# Patient Record
Sex: Female | Born: 1935 | Race: White | Hispanic: No | Smoking: Never smoker
Health system: Southern US, Community
[De-identification: ages and names within clinical notes are randomized; demographics above are authoritative.]

## PROBLEM LIST (undated history)

## (undated) DIAGNOSIS — E079 Disorder of thyroid, unspecified: Secondary | ICD-10-CM

## (undated) DIAGNOSIS — I10 Essential (primary) hypertension: Secondary | ICD-10-CM

## (undated) DIAGNOSIS — M1990 Unspecified osteoarthritis, unspecified site: Secondary | ICD-10-CM

## (undated) DIAGNOSIS — I509 Heart failure, unspecified: Secondary | ICD-10-CM

## (undated) DIAGNOSIS — J45909 Unspecified asthma, uncomplicated: Secondary | ICD-10-CM

## (undated) DIAGNOSIS — E119 Type 2 diabetes mellitus without complications: Secondary | ICD-10-CM

## (undated) HISTORY — PX: CORONARY STENT PLACEMENT: SHX1402

---

## 2010-03-02 ENCOUNTER — Ambulatory Visit (HOSPITAL_COMMUNITY)
Admission: RE | Admit: 2010-03-02 | Discharge: 2010-03-02 | Payer: Self-pay | Admitting: Physical Medicine and Rehabilitation

## 2013-07-23 ENCOUNTER — Encounter (HOSPITAL_COMMUNITY): Payer: Self-pay | Admitting: *Deleted

## 2013-07-23 ENCOUNTER — Observation Stay (HOSPITAL_COMMUNITY): Payer: Medicare Other

## 2013-07-23 ENCOUNTER — Observation Stay (HOSPITAL_COMMUNITY)
Admission: EM | Admit: 2013-07-23 | Discharge: 2013-07-25 | Disposition: A | Discharge status: Home / Self Care | Payer: Medicare Other | Attending: Internal Medicine | Admitting: Internal Medicine

## 2013-07-23 DIAGNOSIS — E1142 Type 2 diabetes mellitus with diabetic polyneuropathy: Secondary | ICD-10-CM | POA: Insufficient documentation

## 2013-07-23 DIAGNOSIS — E1149 Type 2 diabetes mellitus with other diabetic neurological complication: Secondary | ICD-10-CM | POA: Insufficient documentation

## 2013-07-23 DIAGNOSIS — F0390 Unspecified dementia without behavioral disturbance: Secondary | ICD-10-CM

## 2013-07-23 DIAGNOSIS — I5020 Unspecified systolic (congestive) heart failure: Secondary | ICD-10-CM | POA: Insufficient documentation

## 2013-07-23 DIAGNOSIS — T7840XA Allergy, unspecified, initial encounter: Secondary | ICD-10-CM

## 2013-07-23 DIAGNOSIS — I2510 Atherosclerotic heart disease of native coronary artery without angina pectoris: Secondary | ICD-10-CM | POA: Diagnosis present

## 2013-07-23 DIAGNOSIS — M129 Arthropathy, unspecified: Secondary | ICD-10-CM | POA: Insufficient documentation

## 2013-07-23 DIAGNOSIS — N179 Acute kidney failure, unspecified: Secondary | ICD-10-CM | POA: Insufficient documentation

## 2013-07-23 DIAGNOSIS — Z86711 Personal history of pulmonary embolism: Secondary | ICD-10-CM | POA: Insufficient documentation

## 2013-07-23 DIAGNOSIS — E785 Hyperlipidemia, unspecified: Secondary | ICD-10-CM | POA: Diagnosis present

## 2013-07-23 DIAGNOSIS — Z79899 Other long term (current) drug therapy: Secondary | ICD-10-CM | POA: Insufficient documentation

## 2013-07-23 DIAGNOSIS — I509 Heart failure, unspecified: Secondary | ICD-10-CM | POA: Insufficient documentation

## 2013-07-23 DIAGNOSIS — E039 Hypothyroidism, unspecified: Secondary | ICD-10-CM | POA: Insufficient documentation

## 2013-07-23 DIAGNOSIS — I10 Essential (primary) hypertension: Secondary | ICD-10-CM | POA: Diagnosis present

## 2013-07-23 DIAGNOSIS — Z86718 Personal history of other venous thrombosis and embolism: Secondary | ICD-10-CM | POA: Insufficient documentation

## 2013-07-23 DIAGNOSIS — Z9861 Coronary angioplasty status: Secondary | ICD-10-CM | POA: Insufficient documentation

## 2013-07-23 DIAGNOSIS — T50995A Adverse effect of other drugs, medicaments and biological substances, initial encounter: Secondary | ICD-10-CM

## 2013-07-23 DIAGNOSIS — Z7901 Long term (current) use of anticoagulants: Secondary | ICD-10-CM | POA: Insufficient documentation

## 2013-07-23 DIAGNOSIS — IMO0002 Reserved for concepts with insufficient information to code with codable children: Principal | ICD-10-CM | POA: Insufficient documentation

## 2013-07-23 DIAGNOSIS — L500 Allergic urticaria: Secondary | ICD-10-CM | POA: Insufficient documentation

## 2013-07-23 DIAGNOSIS — L0390 Cellulitis, unspecified: Secondary | ICD-10-CM

## 2013-07-23 DIAGNOSIS — T364X5A Adverse effect of tetracyclines, initial encounter: Secondary | ICD-10-CM | POA: Insufficient documentation

## 2013-07-23 DIAGNOSIS — L0291 Cutaneous abscess, unspecified: Secondary | ICD-10-CM

## 2013-07-23 HISTORY — DX: Unspecified osteoarthritis, unspecified site: M19.90

## 2013-07-23 HISTORY — DX: Disorder of thyroid, unspecified: E07.9

## 2013-07-23 HISTORY — DX: Type 2 diabetes mellitus without complications: E11.9

## 2013-07-23 HISTORY — DX: Unspecified asthma, uncomplicated: J45.909

## 2013-07-23 HISTORY — DX: Essential (primary) hypertension: I10

## 2013-07-23 HISTORY — DX: Heart failure, unspecified: I50.9

## 2013-07-23 LAB — GLUCOSE, CAPILLARY: Glucose-Capillary: 150 mg/dL — ABNORMAL HIGH (ref 70–99)

## 2013-07-23 LAB — CBC WITH DIFFERENTIAL/PLATELET
Basophils Absolute: 0 10*3/uL (ref 0.0–0.1)
Hemoglobin: 13.6 g/dL (ref 12.0–15.0)
Lymphs Abs: 1.6 10*3/uL (ref 0.7–4.0)
MCH: 30.4 pg (ref 26.0–34.0)
MCV: 89.5 fL (ref 78.0–100.0)
Neutro Abs: 8.5 10*3/uL — ABNORMAL HIGH (ref 1.7–7.7)
Neutrophils Relative %: 74 % (ref 43–77)
RBC: 4.48 MIL/uL (ref 3.87–5.11)
RDW: 13.4 % (ref 11.5–15.5)
WBC: 11.5 10*3/uL — ABNORMAL HIGH (ref 4.0–10.5)

## 2013-07-23 LAB — BASIC METABOLIC PANEL
CO2: 27 mEq/L (ref 19–32)
Creatinine, Ser: 1.21 mg/dL — ABNORMAL HIGH (ref 0.50–1.10)
GFR calc non Af Amer: 37 mL/min — ABNORMAL LOW (ref >90)
Glucose, Bld: 175 mg/dL — ABNORMAL HIGH (ref 70–99)
Potassium: 3.9 mEq/L (ref 3.5–5.1)

## 2013-07-23 LAB — PROTIME-INR
INR: 2.06 — ABNORMAL HIGH (ref 0.00–1.49)
Prothrombin Time: 22.6 seconds — ABNORMAL HIGH (ref 11.6–15.2)

## 2013-07-23 MED ORDER — METOPROLOL SUCCINATE ER 25 MG PO TB24
25.0000 mg | ORAL_TABLET | Freq: Every day | ORAL | Status: DC
  Administered 2013-07-24 – 2013-07-25 (×2): 25 mg via ORAL
  Filled 2013-07-23 (×2): qty 1

## 2013-07-23 MED ORDER — SODIUM CHLORIDE 0.9 % IJ SOLN
3.0000 mL | Freq: Two times a day (BID) | INTRAMUSCULAR | Status: DC
  Administered 2013-07-23 – 2013-07-25 (×3): 3 mL via INTRAVENOUS

## 2013-07-23 MED ORDER — LEVOTHYROXINE SODIUM 75 MCG PO TABS
75.0000 ug | ORAL_TABLET | Freq: Every day | ORAL | Status: DC
  Administered 2013-07-23 – 2013-07-24 (×2): 75 ug via ORAL
  Filled 2013-07-23 (×2): qty 1

## 2013-07-23 MED ORDER — DIPHENHYDRAMINE HCL 50 MG/ML IJ SOLN
12.5000 mg | Freq: Once | INTRAMUSCULAR | Status: AC
  Administered 2013-07-23: 12.5 mg via INTRAVENOUS
  Filled 2013-07-23 (×2): qty 1

## 2013-07-23 MED ORDER — LEVOTHYROXINE SODIUM 75 MCG PO TABS: 75.0000 ug | ORAL_TABLET | Freq: Every day | ORAL | Status: DC

## 2013-07-23 MED ORDER — DEXTROSE 5 % IV SOLN
1.0000 g | Freq: Once | INTRAVENOUS | Status: AC
  Administered 2013-07-23: 1 g via INTRAVENOUS
  Filled 2013-07-23: qty 10

## 2013-07-23 MED ORDER — DOXYCYCLINE HYCLATE 100 MG PO CAPS: 100.0000 mg | ORAL_CAPSULE | Freq: Two times a day (BID) | ORAL | Status: DC

## 2013-07-23 MED ORDER — GABAPENTIN 300 MG PO CAPS
300.0000 mg | ORAL_CAPSULE | Freq: Three times a day (TID) | ORAL | Status: DC
  Administered 2013-07-23 – 2013-07-25 (×6): 300 mg via ORAL
  Filled 2013-07-23 (×6): qty 1

## 2013-07-23 MED ORDER — SODIUM CHLORIDE 0.9 % IV SOLN: INTRAVENOUS | Status: AC

## 2013-07-23 MED ORDER — LORATADINE 10 MG PO TABS
10.0000 mg | ORAL_TABLET | Freq: Every day | ORAL | Status: DC
  Administered 2013-07-24 – 2013-07-25 (×2): 10 mg via ORAL
  Filled 2013-07-23 (×2): qty 1

## 2013-07-23 MED ORDER — WARFARIN - PHARMACIST DOSING INPATIENT: Status: DC

## 2013-07-23 MED ORDER — ATORVASTATIN CALCIUM 40 MG PO TABS
40.0000 mg | ORAL_TABLET | Freq: Every day | ORAL | Status: DC
  Administered 2013-07-23 – 2013-07-24 (×2): 40 mg via ORAL
  Filled 2013-07-23 (×2): qty 1

## 2013-07-23 MED ORDER — HYDROCODONE-ACETAMINOPHEN 5-325 MG PO TABS
1.0000 | ORAL_TABLET | Freq: Four times a day (QID) | ORAL | Status: DC | PRN
  Administered 2013-07-23 – 2013-07-25 (×3): 1 via ORAL
  Filled 2013-07-23 (×3): qty 1

## 2013-07-23 MED ORDER — OFLOXACIN 0.3 % OT SOLN
5.0000 [drp] | Freq: Every day | OTIC | Status: DC | PRN
  Filled 2013-07-23: qty 5

## 2013-07-23 MED ORDER — INSULIN ASPART 100 UNIT/ML ~~LOC~~ SOLN
0.0000 [IU] | Freq: Three times a day (TID) | SUBCUTANEOUS | Status: DC
  Administered 2013-07-24 (×2): 1 [IU] via SUBCUTANEOUS
  Administered 2013-07-24 – 2013-07-25 (×2): 2 [IU] via SUBCUTANEOUS

## 2013-07-23 MED ORDER — DOXYCYCLINE HYCLATE 100 MG IV SOLR
100.0000 mg | Freq: Once | INTRAVENOUS | Status: AC
  Administered 2013-07-23: 100 mg via INTRAVENOUS
  Filled 2013-07-23: qty 100

## 2013-07-23 MED ORDER — WARFARIN SODIUM 7.5 MG PO TABS
7.5000 mg | ORAL_TABLET | Freq: Once | ORAL | Status: AC
  Administered 2013-07-23: 7.5 mg via ORAL
  Filled 2013-07-23: qty 1

## 2013-07-23 NOTE — ED Provider Notes (Addendum)
CSN: [id number]     Arrival date & time 07/23/13  0944 History  This chart was scribed for Gilda Crease, MD by Bennett Scrape, ED Scribe. This patient was seen in room APA19/APA19 and the patient's care was started at 9:54 AM.   Chief Complaint  Patient presents with  . Arm Injury    The history is provided by the patient. No language interpreter was used.    HPI Comments: Katelyn Kramer is a 79 y.o. female who presents to the Emergency Department complaining of gradual onset, gradually worsening, constant right forearm pain with associated redness and swelling that started yesterday. She states that she was scratched by a cat on the right hand the day before. The pt is currently on anticoagulants. She denies any known fevers, SOB or other pain.  Pt lives with granddaughter who states that the pt has mild confusion at baseline.   Past Medical History  Diagnosis Date  . Arthritis   . Hypertension   . Thyroid disease   . Diabetes mellitus without complication   . CHF (congestive heart failure)    Past Surgical History  Procedure Laterality Date  . Coronary stent placement     History reviewed. No pertinent family history. History  Substance Use Topics  . Smoking status: Never Smoker   . Smokeless tobacco: Not on file  . Alcohol Use: No   No OB history provided.  Review of Systems  Constitutional: Negative for fever.  Musculoskeletal: Positive for arthralgias.  Skin: Positive for color change and wound.  All other systems reviewed and are negative.    Allergies  Review of patient's allergies indicates no known allergies.  Home Medications  No current outpatient prescriptions on file.  Triage Vitals: BP 130/63  Pulse 82  Temp(Src) 98.6 F (37 C) (Oral)  Resp 19  Ht 5\' 7"  (1.702 m)  Wt 195 lb (88.451 kg)  BMI 30.53 kg/m2  SpO2 95%  Physical Exam  Nursing note and vitals reviewed. Constitutional: She is oriented to person, place, and time. She appears  well-developed and well-nourished. No distress.  HENT:  Head: Normocephalic and atraumatic.  Eyes: EOM are normal.  Neck: Neck supple. No tracheal deviation present.  Cardiovascular: Normal rate.   Pulmonary/Chest: Effort normal. No respiratory distress.  Musculoskeletal: Normal range of motion.  Erythema and warmth to the anterior surface of the right forearm   Neurological: She is alert and oriented to person, place, and time.  Skin: Skin is warm and dry.  Psychiatric: She has a normal mood and affect. Her behavior is normal.    ED Course  Procedures (including critical care time)  DIAGNOSTIC STUDIES: Oxygen Saturation is 95% on room air, adequate by my interpretation.    COORDINATION OF CARE: 9:57 AM-Discussed treatment plan which includes CBC panel, BMP and blood culture with pt at bedside and pt agreed to plan.   Labs Review Labs Reviewed  CBC WITH DIFFERENTIAL - Abnormal; Notable for the following:    WBC 11.5 (*)    Neutro Abs 8.5 (*)    Monocytes Absolute 1.1 (*)    All other components within normal limits  BASIC METABOLIC PANEL - Abnormal; Notable for the following:    Glucose, Bld 175 (*)    BUN 24 (*)    Creatinine, Ser 1.21 (*)    GFR calc non Af Amer 37 (*)    GFR calc Af Amer 43 (*)    All other components within normal limits  PROTIME-INR -  Abnormal; Notable for the following:    Prothrombin Time 22.6 (*)    INR 2.06 (*)    All other components within normal limits  CULTURE, BLOOD (ROUTINE X 2)  CULTURE, BLOOD (ROUTINE X 2)   Imaging Review No results found.  MDM  Diagnosis: 1. Cellulitis 2. Allergic Reaction to Doxycycline  Patient presents with increased warmth, swelling, erythema of the back of the right hand and lower arm with lymphangitis up the volar aspect of the arm. Patient thinks she was scratched by a cat 2 days ago and symptoms began yesterday. Family reports that she is somewhat confused, but did not see any scratches, but it was possible  because the cat was in her room. This is not consistent with cat scratch fever, more likely simple cellulitis. Patient administered IV doxycycline. Approximately 15 minutes before the infusion completed, patient started to have hives appear just proximal to the IV site. Additionally she was stopped and the patient was given Benadryl 12.5 mg. no systemic findings, vital signs remained stable.  As the patient is elderly, diabetic, has some worsening signs of cellulitis and has now had an acute allergic reaction to meds, it is felt that the patient would be best served being admitted to the hospital for further management. Case discussed with hospitalist, Doctor Wyonia Hough. Will admit to the hospital, switched to Rocephin.  I personally performed the services described in this documentation, which was scribed in my presence. The recorded information has been reviewed and is accurate.    Gilda Crease, MD 07/23/13 1320  Gilda Crease, MD 07/23/13 1345

## 2013-07-23 NOTE — H&P (Signed)
[facility] History and Physical  Katelyn Kramer ZOX:[id number] DOB: [birth year] DOA: 07/23/2013  Referring physician: Dr. Blinda Leatherwood PCP: Ernestine Conrad, MD  Specialists: none  Chief Complaint: right wrist cellulitis  HPI: Katelyn Kramer is a 79 y.o. female has a past medical history significant for CAD s/p stent, history of DVT/PE on chronic anticoagulation, HTN, HLD, hypothyroidism, comes in with right hand swelling after a cat scratch 2 days ago. She also endorses mild traums to the hand. She has a history of dementia and cannot remember how she hit her hand. She denies any CP, SOB, NVD, HA, lightheadedness or dizziness. She lives with her daughter and her granddaughter is helping as she lives close by. She has 24 h supervision it seems like. In the emergency room, patient was given doxycycline for her right hand cellulitis, however she developed what sounds like an allergic local reaction to it. This has resolved with antibiotic discontinuation and Benadryl administration.  Review of Systems: as per HPI otherwise negative, however somewhat unreliable given dementia  Past Medical History  Diagnosis Date  . Arthritis   . Hypertension   . Thyroid disease   . Diabetes mellitus without complication   . CHF (congestive heart failure)    Past Surgical History  Procedure Laterality Date  . Coronary stent placement     Social History:  reports that she has never smoked. She does not have any smokeless tobacco history on file. She reports that she does not drink alcohol or use illicit drugs.  Allergies  Allergen Reactions  . Doxycycline Hives    History reviewed. No pertinent family history.  Prior to Admission medications   Medication Sig Start Date End Date Taking? Authorizing Provider  amLODipine-benazepril (LOTREL) 5-20 MG per capsule Take 1 capsule by mouth daily.   Yes Historical Provider, MD  atorvastatin (LIPITOR) 40 MG tablet Take 40 mg by mouth daily.   Yes Historical Provider,  MD  fexofenadine (ALLEGRA) 180 MG tablet Take 180 mg by mouth daily.   Yes Historical Provider, MD  fish oil-omega-3 fatty acids 1000 MG capsule Take 2 g by mouth 2 (two) times daily.   Yes Historical Provider, MD  furosemide (LASIX) 40 MG tablet Take 40 mg by mouth.   Yes Historical Provider, MD  gabapentin (NEURONTIN) 100 MG capsule Take 300 mg by mouth 3 (three) times daily.   Yes Historical Provider, MD  glipiZIDE (GLUCOTROL) 10 MG tablet Take 10 mg by mouth daily.   Yes Historical Provider, MD  glucosamine-chondroitin 500-400 MG tablet Take 1 tablet by mouth 3 (three) times daily.   Yes Historical Provider, MD  HYDROcodone-acetaminophen (NORCO/VICODIN) 5-325 MG per tablet Take 0.5-1 tablets by mouth every 6 (six) hours as needed for pain.    Yes Historical Provider, MD  levothyroxine (SYNTHROID, LEVOTHROID) 75 MCG tablet Take 75 mcg by mouth at bedtime.   Yes Historical Provider, MD  metFORMIN (GLUCOPHAGE) 500 MG tablet Take 500 mg by mouth 2 (two) times daily with a meal.   Yes Historical Provider, MD  metoprolol succinate (TOPROL-XL) 25 MG 24 hr tablet Take 25 mg by mouth daily.   Yes Historical Provider, MD  ofloxacin (FLOXIN) 0.3 % otic solution Place 5 drops into both ears daily as needed (ear pain).   Yes Historical Provider, MD  potassium chloride SA (K-DUR,KLOR-CON) 20 MEQ tablet Take 20 mEq by mouth 2 (two) times daily.   Yes Historical Provider, MD  sitaGLIPtin (JANUVIA) 100 MG tablet Take 100 mg by mouth daily.  Yes Historical Provider, MD  warfarin (COUMADIN) 7.5 MG tablet Take 3.75-7.5 mg by mouth every morning. Takes 7.5 mg on Monday, Wednesday, Friday, Saturday and Sunday.  Takes 3.75 mg on Tuesday and Thursday.   Yes Historical Provider, MD  doxycycline (VIBRAMYCIN) 100 MG capsule Take 1 capsule (100 mg total) by mouth 2 (two) times daily. 07/23/13   Gilda Crease, MD   Physical Exam: Filed Vitals:   07/23/13 1004 07/23/13 1230  BP: 130/63 120/67  Pulse: 82 70   Temp: 98.6 F (37 C)   TempSrc: Oral   Resp: 19 21  Height: 5\' 7"  (1.702 m)   Weight: 88.451 kg (195 lb)   SpO2: 95% 93%     General:  No apparent distress, pleasant demented Caucasian female  Eyes: PERRL, EOMI, no scleral icterus  ENT: moist oropharynx  Neck: supple, no JVD  Cardiovascular: regular rate without MRG; 2+ peripheral pulses  Respiratory: CTA biL, good air movement without wheezing, rhonchi or crackled  Abdomen: soft, non tender to palpation, positive bowel sounds, no guarding, no rebound  Skin: no rashes  Musculoskeletal: no peripheral edema; mild swelling of the posterior aspect of the right hand extending over the wrist and proximal forearm. Minimal pain with flexion of the fingers and wrist.  Psychiatric: normal mood and affect  Neurologic: Nonfocal  Labs on Admission:  Basic Metabolic Panel:  Recent Labs Lab 07/23/13 1018  NA 137  K 3.9  CL 98  CO2 27  GLUCOSE 175*  BUN 24*  CREATININE 1.21*  CALCIUM 9.3   CBC:  Recent Labs Lab 07/23/13 1018  WBC 11.5*  NEUTROABS 8.5*  HGB 13.6  HCT 40.1  MCV 89.5  PLT 225    Radiological Exams on Admission: No results found.  EKG: Independently reviewed.  Assessment/Plan Principal Problem:   Cellulitis Active Problems:   Dementia   Essential hypertension, benign   Unspecified hypothyroidism   HLD (hyperlipidemia)   CAD (coronary artery disease)   History of DVT (deep vein thrombosis)   AKI (acute kidney injury)   Chronic anticoagulation  Right hand cellulitis - Start ceftriaxone. She also injured her right hand, however he does not recall how. We'll obtain plain x-rays given the swelling. Hypothyroidism - check TSH Hypertension -Continue metoprolol Diabetes - sliding scale insulin Diabetic neuropathy - continue gabapentin Hyperlipidemia - Lipitor  Dementia - will check B12 and folate. Check TSH as above. History of DVT - Coumadin per pharmacy CAD - statin, metop DVT  prophylaxis - on Coumadin  Code Status: Full code  Family Communication: Granddaughter and daughter bedside  Disposition Plan: Home when medically ready  Time spent: 76  Costin M. Elvera Lennox, MD [facility] Pager [phone]  If 7PM-7AM, please contact night-coverage [email or web address] Password [facility] 07/23/2013, 3:19 PM

## 2013-07-23 NOTE — ED Notes (Signed)
Pt has swollen rt hand/wrist since cat scratch Saturday.

## 2013-07-23 NOTE — Progress Notes (Signed)
ANTICOAGULATION CONSULT NOTE - Initial Consult  Pharmacy Consult for Warfarin Indication: VTE treatment  Allergies  Allergen Reactions  . Doxycycline Hives    Patient Measurements: Height: 5\' 7"  (170.2 cm) Weight: 195 lb (88.451 kg) IBW/kg (Calculated) : 61.6   Vital Signs: Temp: 98.6 F (37 C) (09/01 1004) Temp src: Oral (09/01 1004) BP: 120/67 mmHg (09/01 1230) Pulse Rate: 70 (09/01 1230)  Labs:  Recent Labs  07/23/13 1018  HGB 13.6  HCT 40.1  PLT 225  LABPROT 22.6*  INR 2.06*  CREATININE 1.21*    Estimated Creatinine Clearance: 33.2 ml/min (by C-G formula based on Cr of 1.21).   Medical History: Past Medical History  Diagnosis Date  . Arthritis   . Hypertension   . Thyroid disease   . Diabetes mellitus without complication   . CHF (congestive heart failure)   . Asthma     Medications:  Scheduled:  . insulin aspart  0-9 Units Subcutaneous TID WC  . sodium chloride  3 mL Intravenous Q12H  . warfarin  7.5 mg Oral Once  . Warfarin - Pharmacist Dosing Inpatient   Does not apply Q24H    Assessment: Continuation of Warfarin from home PTA Warfarin 7.5 mg on Mon,Wed,Fri,Sat,Sun ,  3.75 mg on Tues,Thurs INR therapeutic on admission  Goal of Therapy:  INR 2-3 Monitor platelets by anticoagulation protocol: Yes   Plan:  Warfarin 7.5 mg po today at 4 PM INR/PT daily CBC, monitor platelets   Raquel James, Evelyn Aguinaldo Bennett 07/23/2013,3:50 PM

## 2013-07-23 NOTE — ED Notes (Signed)
Patient has whelp above IV site. IV antibiotics stopped. Denies any itching or pain. IV flushing well. Denies any pain. Redness in arm increased. Dr Blinda Leatherwood made aware, to room to see patient.

## 2013-07-23 NOTE — ED Notes (Signed)
Patient with c/o blister and redness directly proximal to IV site. IV flushes without difficulty and palpable above blistered area and blood returns easily from IV. Redness noted to follow vein IV is in and family reports worsening redness to arm medial to IV site. Denies itching, shortness of breath, or chest pain. No distress. Crystal, primary RN notified and will notify physician.

## 2013-07-24 LAB — CBC
HCT: 35.8 % — ABNORMAL LOW (ref 36.0–46.0)
Hemoglobin: 12.2 g/dL (ref 12.0–15.0)
MCH: 30.6 pg (ref 26.0–34.0)
MCV: 89.7 fL (ref 78.0–100.0)
RBC: 3.99 MIL/uL (ref 3.87–5.11)

## 2013-07-24 LAB — URINE MICROSCOPIC-ADD ON

## 2013-07-24 LAB — HEMOGLOBIN A1C
Hgb A1c MFr Bld: 7.6 % — ABNORMAL HIGH (ref <5.7)
Mean Plasma Glucose: 171 mg/dL — ABNORMAL HIGH (ref <117)

## 2013-07-24 LAB — URINALYSIS, ROUTINE W REFLEX MICROSCOPIC
Glucose, UA: NEGATIVE mg/dL
Ketones, ur: NEGATIVE mg/dL
Protein, ur: NEGATIVE mg/dL
Urobilinogen, UA: 0.2 mg/dL (ref 0.0–1.0)

## 2013-07-24 LAB — BASIC METABOLIC PANEL
CO2: 28 mEq/L (ref 19–32)
Calcium: 8.4 mg/dL (ref 8.4–10.5)
Potassium: 3.8 mEq/L (ref 3.5–5.1)
Sodium: 138 mEq/L (ref 135–145)

## 2013-07-24 LAB — VITAMIN B12: Vitamin B-12: 278 pg/mL (ref 211–911)

## 2013-07-24 LAB — GLUCOSE, CAPILLARY
Glucose-Capillary: 126 mg/dL — ABNORMAL HIGH (ref 70–99)
Glucose-Capillary: 164 mg/dL — ABNORMAL HIGH (ref 70–99)
Glucose-Capillary: 146 mg/dL — ABNORMAL HIGH (ref 70–99)

## 2013-07-24 LAB — TSH: TSH: 0.773 u[IU]/mL (ref 0.350–4.500)

## 2013-07-24 MED ORDER — SODIUM CHLORIDE 0.9 % IV SOLN
3.0000 g | Freq: Four times a day (QID) | INTRAVENOUS | Status: DC
  Administered 2013-07-24 – 2013-07-25 (×5): 3 g via INTRAVENOUS
  Filled 2013-07-24 (×9): qty 3

## 2013-07-24 MED ORDER — WARFARIN SODIUM 2 MG PO TABS
3.0000 mg | ORAL_TABLET | Freq: Once | ORAL | Status: AC
  Administered 2013-07-24: 3 mg via ORAL
  Filled 2013-07-24: qty 1

## 2013-07-24 NOTE — Progress Notes (Signed)
[facility] PROGRESS NOTE  Katelyn Kramer WJX:[id number] DOB: November 29, 1917 DOA: 07/23/2013 PCP: Ernestine Conrad, MD  HPI: Katelyn Kramer is a 79 y.o. female has a past medical history significant for CAD s/p stent, history of DVT/PE on chronic anticoagulation, HTN, HLD, hypothyroidism, comes in with right hand swelling after a cat scratch 2 days ago. She also endorses mild traums to the hand. She has a history of dementia and cannot remember how she hit her hand. She denies any CP, SOB, NVD, HA, lightheadedness or dizziness. She lives with her daughter and her granddaughter is helping as she lives close by. She has 24 h supervision it seems like. In the emergency room, patient was given doxycycline for her right hand cellulitis, however she developed what sounds like an allergic local reaction to it. This has resolved with antibiotic discontinuation and Benadryl administration.  Assessment/Plan: Right hand cellulitis  - Started ceftriaxone in the ED, switch to Unasyn. This morning there is minimal improvement and still apparent streaking upwards from her wrist. She needs another day of IV antibiotics and may be able to switch to po in am. Plain XR normal.  Hypothyroidism - TSH normal Hypertension -Continue metoprolol  Diabetes - sliding scale insulin  Diabetic neuropathy - continue gabapentin  Hyperlipidemia - Lipitor  Dementia - will check B12 and folate. Check TSH as above.  History of DVT  - Coumadin per pharmacy  CAD - statin, metop  DVT prophylaxis - on Coumadin  Code Status: Full Family Communication: daughter bedside  Disposition Plan: home likely 24 h.  Consultants:  none  Procedures:  none  Anti-infectives   Start     Dose/Rate Route Frequency Ordered Stop   07/24/13 1000  Ampicillin-Sulbactam (UNASYN) 3 g in sodium chloride 0.9 % 100 mL IVPB     3 g 100 mL/hr over 60 Minutes Intravenous Every 6 hours 07/24/13 0813     07/23/13 1345  cefTRIAXone (ROCEPHIN) 1 g in dextrose 5  % 50 mL IVPB     1 g 100 mL/hr over 30 Minutes Intravenous  Once 07/23/13 1343 07/23/13 1455   07/23/13 1115  doxycycline (VIBRAMYCIN) 100 mg in dextrose 5 % 250 mL IVPB     100 mg 125 mL/hr over 120 Minutes Intravenous  Once 07/23/13 1111 07/23/13 1326   07/23/13 0000  doxycycline (VIBRAMYCIN) 100 MG capsule     100 mg Oral 2 times daily 07/23/13 1322       Antibiotics Given (last 72 hours)   Date/Time Action Medication Dose Rate   07/24/13 0957 Given   Ampicillin-Sulbactam (UNASYN) 3 g in sodium chloride 0.9 % 100 mL IVPB 3 g 100 mL/hr      HPI/Subjective: - complains of pain right wrist  Objective: Filed Vitals:   07/23/13 1004 07/23/13 1230 07/24/13 0533  BP: 130/63 120/67 139/67  Pulse: 82 70 70  Temp: 98.6 F (37 C)  98 F (36.7 C)  TempSrc: Oral  Oral  Resp: 19 21 20   Height: 5\' 7"  (1.702 m)    Weight: 88.451 kg (195 lb)    SpO2: 95% 93% 93%    Intake/Output Summary (Last 24 hours) at 07/24/13 1103 Last data filed at 07/24/13 0900  Gross per 24 hour  Intake    240 ml  Output    300 ml  Net    -60 ml   Filed Weights   07/23/13 1004  Weight: 88.451 kg (195 lb)    Exam:  General: No apparent distress, pleasant demented  Caucasian female Eyes: PERRL, EOMI, no scleral icterus ENT: moist oropharynx Neck: supple, no JVD Cardiovascular: regular rate without MRG; 2+ peripheral pulses Respiratory: CTA biL, good air movement without wheezing, rhonchi or crackled Abdomen: soft, non tender to palpation, positive bowel sounds, no guarding, no rebound Skin: no rashes Musculoskeletal: no peripheral edema; mild swelling of the posterior aspect of the right hand extending over the wrist and proximal forearm extending upwards. Minimal if any improvement overnight. Margins drawn this morning. Still minimal pain with flexion of the fingers and wrist. Psychiatric: normal mood and affect Neurologic: Nonfocal   Data Reviewed: Basic Metabolic Panel:  Recent Labs Lab  08-20-2013 1018 07/24/13 0614  NA 137 138  K 3.9 3.8  CL 98 100  CO2 27 28  GLUCOSE 175* 137*  BUN 24* 22  CREATININE 1.21* 0.99  CALCIUM 9.3 8.4   CBC:  Recent Labs Lab 08-20-2013 1018 07/24/13 0614  WBC 11.5* 7.7  NEUTROABS 8.5*  --   HGB 13.6 12.2  HCT 40.1 35.8*  MCV 89.5 89.7  PLT 225 211   CBG:  Recent Labs Lab 08-20-2013 1655 Aug 20, 2013 2101 07/24/13 0726  GLUCAP 87 150* 126*    Recent Results (from the past 240 hour(s))  CULTURE, BLOOD (ROUTINE X 2)     Status: None   Collection Time    08/20/2013 10:18 AM      Result Value Range Status   Specimen Description BLOOD RIGHT FOREARM   Final   Special Requests BOTTLES DRAWN AEROBIC AND ANAEROBIC 6CC   Final   Culture NO GROWTH 1 DAY   Final   Report Status PENDING   Incomplete  CULTURE, BLOOD (ROUTINE X 2)     Status: None   Collection Time    Aug 20, 2013 10:29 AM      Result Value Range Status   Specimen Description BLOOD LEFT HAND   Final   Special Requests BOTTLES DRAWN AEROBIC AND ANAEROBIC 6CC   Final   Culture NO GROWTH 1 DAY   Final   Report Status PENDING   Incomplete     Studies: Dg Wrist 2 Views Right  Aug 20, 2013   *RADIOLOGY REPORT*  Clinical Data: Swelling  RIGHT WRIST - 2 VIEW  Comparison: None.  Findings: Normal alignment without fracture.  Osteoarthritis of the right first [facility] joint and the carpal bones along the radial aspect of the wrist.  Distal radius and ulna intact.  No soft tissue abnormality.  IMPRESSION: Osteoarthritis.  No acute finding.   Original Report Authenticated By: Judie Petit. Miles Costain, M.D.   Dg Hand 2 View Right  08/20/13   CLINICAL DATA:  Right hand swelling.  EXAM: RIGHT HAND - 2 VIEW  COMPARISON:  None.  FINDINGS: Early degenerative delete that mild degenerative changes in the IP joint and 1st carpometacarpal joint. No acute bony abnormality. Specifically, no fracture, subluxation, or dislocation. Soft tissues are intact. No radiopaque foreign bodies.  IMPRESSION: No acute findings.    Electronically Signed   By: Charlett Nose   On: 08-20-2013 16:43    Scheduled Meds: . ampicillin-sulbactam (UNASYN) IV  3 g Intravenous Q6H  . atorvastatin  40 mg Oral q1800  . gabapentin  300 mg Oral TID  . insulin aspart  0-9 Units Subcutaneous TID WC  . levothyroxine  75 mcg Oral QHS  . loratadine  10 mg Oral Daily  . metoprolol succinate  25 mg Oral Daily  . sodium chloride  3 mL Intravenous Q12H  . warfarin  3  mg Oral Once  . Warfarin - Pharmacist Dosing Inpatient   Does not apply Q24H   Continuous Infusions:   Principal Problem:   Cellulitis Active Problems:   Dementia   Essential hypertension, benign   Unspecified hypothyroidism   HLD (hyperlipidemia)   CAD (coronary artery disease)   History of DVT (deep vein thrombosis)   AKI (acute kidney injury)   Chronic anticoagulation   Time spent: 35  Pamella Pert, MD [facility] Pager [phone]. If 7 PM - 7 AM, please contact night-coverage at [email or web address], password [facility] 07/24/2013, 11:03 AM  LOS: 1 day

## 2013-07-24 NOTE — Clinical Social Work Note (Signed)
CSW met with pt and pt's daughter Katelyn Kramer at bedside following referral for advance directives. They had copy of advance directives already. Pt alert and oriented and CSW discussed HCPOA and living will. She filled out HCPOA and part of living will, but then became confused and frustrated with some of the terms. Pt requested to revisit tomorrow. Daughter aware that pt will need to understand and clearly explain living will in order to complete. Pt lives with Katelyn Kramer and manages fine at home. She has a walker and motorized wheelchair. Pt plans to return home at d/c.   Derenda Fennel, LCSW [phone]

## 2013-07-24 NOTE — Progress Notes (Signed)
ANTICOAGULATION CONSULT NOTE  Pharmacy Consult for Warfarin Indication: hx of DVT  Allergies  Allergen Reactions  . Doxycycline Hives    Patient Measurements: Height: 5\' 7"  (170.2 cm) Weight: 195 lb (88.451 kg) IBW/kg (Calculated) : 61.6   Vital Signs: Temp: 98 F (36.7 C) (09/02 0533) Temp src: Oral (09/02 0533) BP: 139/67 mmHg (09/02 0533) Pulse Rate: 70 (09/02 0533)  Labs:  Recent Labs  07/23/13 1018 07/24/13 0614  HGB 13.6 12.2  HCT 40.1 35.8*  PLT 225 211  LABPROT 22.6* 26.4*  INR 2.06* 2.53*  CREATININE 1.21* 0.99    Estimated Creatinine Clearance: 40.6 ml/min (by C-G formula based on Cr of 0.99).   Medical History: Past Medical History  Diagnosis Date  . Arthritis   . Hypertension   . Thyroid disease   . Diabetes mellitus without complication   . CHF (congestive heart failure)   . Asthma     Medications:  Scheduled:  . ampicillin-sulbactam (UNASYN) IV  3 g Intravenous Q6H  . atorvastatin  40 mg Oral q1800  . gabapentin  300 mg Oral TID  . insulin aspart  0-9 Units Subcutaneous TID WC  . levothyroxine  75 mcg Oral QHS  . loratadine  10 mg Oral Daily  . metoprolol succinate  25 mg Oral Daily  . sodium chloride  3 mL Intravenous Q12H  . Warfarin - Pharmacist Dosing Inpatient   Does not apply Q24H    Assessment: 79 yo F on chronic warfarin 7.5 mg daily except 3.75 mg on Tues &Thurs for hx DVT. INR therapeutic on admission. She was admitted for IV antibiotics to treat a cat scratch.   No bleeding noted.   Goal of Therapy:  INR 2-3   Plan:  Warfarin 3mg  po x1 today INR daily  Elson Clan 07/24/2013,8:42 AM

## 2013-07-24 NOTE — Care Management Note (Signed)
    Page 1 of 1   07/25/2013     4:28:10 PM   CARE MANAGEMENT NOTE 07/25/2013  Patient:  Katelyn Kramer, Katelyn Kramer   Account Number:  [id number]  Date Initiated:  07/24/2013  Documentation initiated by:  Anibal Henderson  Subjective/Objective Assessment:   Admitted with rt hand/forearm cellulitis. Pt states she was scratched by her cat, but not sure if this is the cause. Pt is from home with daughter, whe is a Engineer, civil (consulting), so they do not think they will need any thing at D/C even if pt needs  dsgs     Action/Plan:   No CM needs identified at present- will follow   Anticipated DC Date:  07/25/2013   Anticipated DC Plan:  HOME/SELF CARE      DC Planning Services  CM consult      Choice offered to / List presented to:             Status of service:  Completed, signed off Medicare Important Message given?   (If response is "NO", the following Medicare IM given date fields will be blank) Date Medicare IM given:   Date Additional Medicare IM given:    Discharge Disposition:  HOME/SELF CARE  Per UR Regulation:  Reviewed for med. necessity/level of care/duration of stay  If discussed at Long Length of Stay Meetings, dates discussed:    Comments:  07/24/13 1130 Anibal Henderson RN/CM

## 2013-07-24 NOTE — Progress Notes (Signed)
UR Chart Review Completed  

## 2013-07-24 NOTE — Progress Notes (Signed)
PT Cancellation Note  Patient Details Name: Katelyn Kramer MRN: [id number] DOB: 03-29-1918   Cancelled Treatment:    Reason Eval/Treat Not Completed: PT screened, no needs identified, will sign off Interviewed pt/daughter.  Pt ambulates with a walker and lives with daughter.  Daughter states that pt has been managing well at home and they do not anticipate any new problems.  She should not have any difficulty gripping the walker with her right hand..idiopathic thrombocytopenic purpura has full ROM and she is using the hand functionally this AM.  If any concerns arise, please reconsult.  Myrlene Broker L 07/24/2013, 8:53 AM

## 2013-07-25 LAB — CBC
MCH: 30.4 pg (ref 26.0–34.0)
MCV: 90.2 fL (ref 78.0–100.0)
Platelets: 229 10*3/uL (ref 150–400)
RDW: 13.4 % (ref 11.5–15.5)
WBC: 6.7 10*3/uL (ref 4.0–10.5)

## 2013-07-25 LAB — GLUCOSE, CAPILLARY

## 2013-07-25 LAB — URINE CULTURE

## 2013-07-25 MED ORDER — AMOXICILLIN-POT CLAVULANATE 500-125 MG PO TABS: 1.0000 | ORAL_TABLET | Freq: Three times a day (TID) | ORAL | Status: DC

## 2013-07-25 MED ORDER — WARFARIN SODIUM 7.5 MG PO TABS: 7.5000 mg | ORAL_TABLET | Freq: Once | ORAL | Status: DC

## 2013-07-25 NOTE — Progress Notes (Signed)
ANTICOAGULATION CONSULT NOTE  Pharmacy Consult for Warfarin Indication: hx of DVT  Allergies  Allergen Reactions  . Doxycycline Hives    Patient Measurements: Height: 5\' 7"  (170.2 cm) Weight: 195 lb (88.451 kg) IBW/kg (Calculated) : 61.6   Vital Signs: Temp: 98.4 F (36.9 C) (09/03 0536) Temp src: Oral (09/03 0536) BP: 160/92 mmHg (09/03 0536) Pulse Rate: 59 (09/03 0536)  Labs:  Recent Labs  07/23/13 1018 07/24/13 0614 07/25/13 0600  HGB 13.6 12.2 12.7  HCT 40.1 35.8* 37.7  PLT 225 211 229  LABPROT 22.6* 26.4* 26.3*  INR 2.06* 2.53* 2.52*  CREATININE 1.21* 0.99  --     Estimated Creatinine Clearance: 40.6 ml/min (by C-G formula based on Cr of 0.99).   Medical History: Past Medical History  Diagnosis Date  . Arthritis   . Hypertension   . Thyroid disease   . Diabetes mellitus without complication   . CHF (congestive heart failure)   . Asthma     Medications:  Scheduled:  . ampicillin-sulbactam (UNASYN) IV  3 g Intravenous Q6H  . atorvastatin  40 mg Oral q1800  . gabapentin  300 mg Oral TID  . insulin aspart  0-9 Units Subcutaneous TID WC  . levothyroxine  75 mcg Oral QHS  . loratadine  10 mg Oral Daily  . metoprolol succinate  25 mg Oral Daily  . sodium chloride  3 mL Intravenous Q12H  . warfarin  7.5 mg Oral ONCE-1800  . Warfarin - Pharmacist Dosing Inpatient   Does not apply Q24H    Assessment: 79 yo F on chronic warfarin 7.5 mg daily except 3.75 mg on Tues &Thurs for hx DVT. INR therapeutic on admission. She was admitted for IV antibiotics to treat a cat scratch.   No bleeding noted.   Goal of Therapy:  INR 2-3   Plan:  Warfarin 7.5 mg po x1 today (per home regimen) INR daily  Elson Clan 07/25/2013,8:04 AM

## 2013-07-25 NOTE — Discharge Summary (Signed)
Physician Discharge Summary  Katelyn Kramer ZOX:[id number] DOB: [birth year] DOA: 07/23/2013  PCP: Ernestine Conrad, MD  Admit date: 07/23/2013 Discharge date: 07/25/2013  Time spent: Greater than 30 minutes  Recommendations for Outpatient Follow-up:  1. Follow up primary care physician within a week.  Discharge Diagnoses:  1. Right forearm cellulitis, significantly improved. 2.Mild dementia. 3.Hypertension. 4.Hypothyroidism.   Discharge Condition: Stable and improving.  Diet recommendation: Carbohydrate modified diet.  Filed Weights   07/23/13 1004  Weight: 88.451 kg (195 lb)    History of present illness:  This very pleasant 79 year old lady presented to the hospital with symptoms of right wrist and right forearm swelling and inflammation. Please see initial history as outlined below: HPI: Katelyn Kramer is a 79 y.o. female has a past medical history significant for CAD s/p stent, history of DVT/PE on chronic anticoagulation, HTN, HLD, hypothyroidism, comes in with right hand swelling after a cat scratch 2 days ago. She also endorses mild traums to the hand. She has a history of dementia and cannot remember how she hit her hand. She denies any CP, SOB, NVD, HA, lightheadedness or dizziness. She lives with her daughter and her granddaughter is helping as she lives close by. She has 24 h supervision it seems like. In the emergency room, patient was given doxycycline for her right hand cellulitis, however she developed what sounds like an allergic local reaction to it. This has resolved with antibiotic discontinuation and Benadryl administration.  Hospital Course:  The patient was treated appropriately with intravenous antibiotics of Unasyn. The patient is made inappropriate response and has improved significantly over the last 24 hours. She feels she can go home now. She is eating and drinking well. Her renal function has remained stable. She will follow with her primary care physician in a week's  time. She will need a further one week course of oral antibiotics with Augmentin.  Procedures:  None.  Consultations:  None.  Discharge Exam: Filed Vitals:   07/25/13 1005  BP: 158/61  Pulse: 65  Temp: 97.9 F (36.6 C)  Resp: 20    General: She looks systemically well. She is not toxic or septic. Cardiovascular: Heart sounds are present without murmurs or added sounds. Respiratory: Lung fields are clear. She is alert and relatively well orientated. She appears to have mild dementia.  Discharge Instructions  Discharge Orders   Future Orders Complete By Expires   Diet - low sodium heart healthy  As directed    Increase activity slowly  As directed        Medication List         amLODipine-benazepril 5-20 MG per capsule  Commonly known as:  LOTREL  Take 1 capsule by mouth daily.     amoxicillin-clavulanate 500-125 MG per tablet  Commonly known as:  AUGMENTIN  Take 1 tablet (500 mg total) by mouth 3 (three) times daily.     atorvastatin 40 MG tablet  Commonly known as:  LIPITOR  Take 40 mg by mouth daily.     doxycycline 100 MG capsule  Commonly known as:  VIBRAMYCIN  Take 1 capsule (100 mg total) by mouth 2 (two) times daily.     fexofenadine 180 MG tablet  Commonly known as:  ALLEGRA  Take 180 mg by mouth daily.     fish oil-omega-3 fatty acids 1000 MG capsule  Take 2 g by mouth 2 (two) times daily.     furosemide 40 MG tablet  Commonly known as:  LASIX  Take 40  mg by mouth.     gabapentin 100 MG capsule  Commonly known as:  NEURONTIN  Take 300 mg by mouth 3 (three) times daily.     glipiZIDE 10 MG tablet  Commonly known as:  GLUCOTROL  Take 10 mg by mouth daily.     glucosamine-chondroitin 500-400 MG tablet  Take 1 tablet by mouth 3 (three) times daily.     HYDROcodone-acetaminophen 5-325 MG per tablet  Commonly known as:  NORCO/VICODIN  Take 0.5-1 tablets by mouth every 6 (six) hours as needed for pain.     levothyroxine 75 MCG tablet   Commonly known as:  SYNTHROID, LEVOTHROID  Take 75 mcg by mouth at bedtime.     metFORMIN 500 MG tablet  Commonly known as:  GLUCOPHAGE  Take 500 mg by mouth 2 (two) times daily with a meal.     metoprolol succinate 25 MG 24 hr tablet  Commonly known as:  TOPROL-XL  Take 25 mg by mouth daily.     ofloxacin 0.3 % otic solution  Commonly known as:  FLOXIN  Place 5 drops into both ears daily as needed (ear pain).     potassium chloride SA 20 MEQ tablet  Commonly known as:  K-DUR,KLOR-CON  Take 20 mEq by mouth 2 (two) times daily.     sitaGLIPtin 100 MG tablet  Commonly known as:  JANUVIA  Take 100 mg by mouth daily.     warfarin 7.5 MG tablet  Commonly known as:  COUMADIN  Take 3.75-7.5 mg by mouth every morning. Takes 7.5 mg on Monday, Wednesday, Friday, Saturday and Sunday.  Takes 3.75 mg on Tuesday and Thursday.       Allergies  Allergen Reactions  . Doxycycline Hives       Follow-up Information   Follow up with Ernestine Conrad, MD. Schedule an appointment as soon as possible for a visit on 07/31/2013.   Specialty:  Family Medicine   Contact information:   [facility] [address] [place] [address] [phone]        The results of significant diagnostics from this hospitalization (including imaging, microbiology, ancillary and laboratory) are listed below for reference.    Significant Diagnostic Studies: Dg Wrist 2 Views Right  17-Aug-2013   *RADIOLOGY REPORT*  Clinical Data: Swelling  RIGHT WRIST - 2 VIEW  Comparison: None.  Findings: Normal alignment without fracture.  Osteoarthritis of the right first [facility] joint and the carpal bones along the radial aspect of the wrist.  Distal radius and ulna intact.  No soft tissue abnormality.  IMPRESSION: Osteoarthritis.  No acute finding.   Original Report Authenticated By: Judie Petit. Miles Costain, M.D.   Dg Hand 2 View Right  17-Aug-2013   CLINICAL DATA:  Right hand swelling.  EXAM: RIGHT HAND - 2 VIEW  COMPARISON:  None.   FINDINGS: Early degenerative delete that mild degenerative changes in the IP joint and 1st carpometacarpal joint. No acute bony abnormality. Specifically, no fracture, subluxation, or dislocation. Soft tissues are intact. No radiopaque foreign bodies.  IMPRESSION: No acute findings.   Electronically Signed   By: Charlett Nose   On: 08-17-2013 16:43    Microbiology: Recent Results (from the past 240 hour(s))  CULTURE, BLOOD (ROUTINE X 2)     Status: None   Collection Time    17-Aug-2013 10:18 AM      Result Value Range Status   Specimen Description BLOOD RIGHT FOREARM   Final   Special Requests BOTTLES DRAWN AEROBIC  AND ANAEROBIC 6CC   Final   Culture NO GROWTH 2 DAYS   Final   Report Status PENDING   Incomplete  CULTURE, BLOOD (ROUTINE X 2)     Status: None   Collection Time    07/23/13 10:29 AM      Result Value Range Status   Specimen Description BLOOD LEFT HAND   Final   Special Requests BOTTLES DRAWN AEROBIC AND ANAEROBIC 6CC   Final   Culture NO GROWTH 2 DAYS   Final   Report Status PENDING   Incomplete  URINE CULTURE     Status: None   Collection Time    07/24/13  9:20 AM      Result Value Range Status   Specimen Description URINE, CLEAN CATCH   Final   Special Requests NONE   Final   Culture  Setup Time     Final   Value: 07/24/2013 10:30     Performed at [facility] Count     Final   Value: NO GROWTH     Performed at [facility]   Culture     Final   Value: NO GROWTH     Performed at [facility]   Report Status 07/25/2013 FINAL   Final     Labs: Basic Metabolic Panel:  Recent Labs Lab 07/23/13 1018 07/24/13 0614  NA 137 138  K 3.9 3.8  CL 98 100  CO2 27 28  GLUCOSE 175* 137*  BUN 24* 22  CREATININE 1.21* 0.99  CALCIUM 9.3 8.4       CBC:  Recent Labs Lab 07/23/13 1018 07/24/13 0614 07/25/13 0600  WBC 11.5* 7.7 6.7  NEUTROABS 8.5*  --   --   HGB 13.6 12.2 12.7  HCT 40.1 35.8* 37.7  MCV 89.5 89.7 90.2  PLT 225  211 229    CBG:  Recent Labs Lab 07/24/13 0726 07/24/13 1127 07/24/13 1610 07/24/13 2138 07/25/13 0745  GLUCAP 126* 164* 146* 144* 160*       Signed:  GOSRANI,NIMISH C  [facility] 07/25/2013, 10:46 AM

## 2013-07-25 NOTE — Progress Notes (Signed)
Pt discharged home today per Dr. Karilyn Cota. Pt's IV site D/C'd and WNL. Pt's VS stable at this time. Pt and pt's daughter provided with home medication list, discharge instructions and prescriptions. Verbalized understanding. Pt left floor in motorized wheelchair in stable condition accompanied by NT and daughter.

## 2013-07-25 NOTE — Clinical Social Work Note (Signed)
Pt able to verbalize understanding of living will today and completed advance directive. Original and copy given to pt and copy put on shadow chart. Pt d/c home today. No other needs reported for CSW. Signing off.  Derenda Fennel, [place] [phone]

## 2013-07-29 LAB — CULTURE, BLOOD (ROUTINE X 2)

## 2013-12-14 ENCOUNTER — Encounter (HOSPITAL_COMMUNITY): Payer: Self-pay | Admitting: Emergency Medicine

## 2013-12-14 ENCOUNTER — Emergency Department (HOSPITAL_COMMUNITY)
Admission: EM | Admit: 2013-12-14 | Discharge: 2013-12-14 | Disposition: A | Discharge status: Home / Self Care | Payer: Medicare Other | Attending: Emergency Medicine | Admitting: Emergency Medicine

## 2013-12-14 ENCOUNTER — Emergency Department (HOSPITAL_COMMUNITY): Payer: Medicare Other

## 2013-12-14 DIAGNOSIS — W19XXXA Unspecified fall, initial encounter: Secondary | ICD-10-CM

## 2013-12-14 DIAGNOSIS — S0083XA Contusion of other part of head, initial encounter: Principal | ICD-10-CM

## 2013-12-14 DIAGNOSIS — M129 Arthropathy, unspecified: Secondary | ICD-10-CM | POA: Insufficient documentation

## 2013-12-14 DIAGNOSIS — E119 Type 2 diabetes mellitus without complications: Secondary | ICD-10-CM | POA: Insufficient documentation

## 2013-12-14 DIAGNOSIS — Y92009 Unspecified place in unspecified non-institutional (private) residence as the place of occurrence of the external cause: Secondary | ICD-10-CM | POA: Insufficient documentation

## 2013-12-14 DIAGNOSIS — Z9861 Coronary angioplasty status: Secondary | ICD-10-CM | POA: Insufficient documentation

## 2013-12-14 DIAGNOSIS — Z7901 Long term (current) use of anticoagulants: Secondary | ICD-10-CM

## 2013-12-14 DIAGNOSIS — Y9389 Activity, other specified: Secondary | ICD-10-CM | POA: Insufficient documentation

## 2013-12-14 DIAGNOSIS — W1809XA Striking against other object with subsequent fall, initial encounter: Secondary | ICD-10-CM | POA: Insufficient documentation

## 2013-12-14 DIAGNOSIS — I10 Essential (primary) hypertension: Secondary | ICD-10-CM | POA: Insufficient documentation

## 2013-12-14 DIAGNOSIS — Z792 Long term (current) use of antibiotics: Secondary | ICD-10-CM | POA: Insufficient documentation

## 2013-12-14 DIAGNOSIS — I509 Heart failure, unspecified: Secondary | ICD-10-CM | POA: Insufficient documentation

## 2013-12-14 DIAGNOSIS — J45909 Unspecified asthma, uncomplicated: Secondary | ICD-10-CM | POA: Insufficient documentation

## 2013-12-14 DIAGNOSIS — Z79899 Other long term (current) drug therapy: Secondary | ICD-10-CM | POA: Insufficient documentation

## 2013-12-14 DIAGNOSIS — E079 Disorder of thyroid, unspecified: Secondary | ICD-10-CM | POA: Insufficient documentation

## 2013-12-14 DIAGNOSIS — S1093XA Contusion of unspecified part of neck, initial encounter: Principal | ICD-10-CM

## 2013-12-14 DIAGNOSIS — S0003XA Contusion of scalp, initial encounter: Secondary | ICD-10-CM | POA: Insufficient documentation

## 2013-12-14 LAB — BASIC METABOLIC PANEL
BUN: 28 mg/dL — ABNORMAL HIGH (ref 6–23)
CHLORIDE: 101 meq/L (ref 96–112)
CO2: 23 meq/L (ref 19–32)
Calcium: 8.7 mg/dL (ref 8.4–10.5)
Creatinine, Ser: 0.99 mg/dL (ref 0.50–1.10)
GFR calc Af Amer: 55 mL/min — ABNORMAL LOW (ref >90)
GFR calc non Af Amer: 48 mL/min — ABNORMAL LOW (ref >90)
Glucose, Bld: 142 mg/dL — ABNORMAL HIGH (ref 70–99)
Potassium: 4.7 mEq/L (ref 3.7–5.3)
SODIUM: 140 meq/L (ref 137–147)

## 2013-12-14 LAB — CBC WITH DIFFERENTIAL/PLATELET
BASOS ABS: 0 10*3/uL (ref 0.0–0.1)
Basophils Relative: 0 % (ref 0–1)
Eosinophils Absolute: 0.7 10*3/uL (ref 0.0–0.7)
Eosinophils Relative: 9 % — ABNORMAL HIGH (ref 0–5)
HCT: 38.6 % (ref 36.0–46.0)
Hemoglobin: 13.1 g/dL (ref 12.0–15.0)
Lymphocytes Relative: 27 % (ref 12–46)
Lymphs Abs: 2.1 10*3/uL (ref 0.7–4.0)
MCH: 30.4 pg (ref 26.0–34.0)
MCHC: 33.9 g/dL (ref 30.0–36.0)
MCV: 89.6 fL (ref 78.0–100.0)
Monocytes Absolute: 0.6 10*3/uL (ref 0.1–1.0)
Monocytes Relative: 8 % (ref 3–12)
NEUTROS ABS: 4.3 10*3/uL (ref 1.7–7.7)
NEUTROS PCT: 56 % (ref 43–77)
Platelets: 270 10*3/uL (ref 150–400)
RBC: 4.31 MIL/uL (ref 3.87–5.11)
RDW: 13.7 % (ref 11.5–15.5)
WBC: 7.8 10*3/uL (ref 4.0–10.5)

## 2013-12-14 LAB — PROTIME-INR
INR: 1.59 — AB (ref 0.00–1.49)
Prothrombin Time: 18.5 seconds — ABNORMAL HIGH (ref 11.6–15.2)

## 2013-12-14 NOTE — Discharge Instructions (Signed)
Facial or Scalp Contusion °A facial or scalp contusion is a deep bruise on the face or head. Injuries to the face and head generally cause a lot of swelling, especially around the eyes. Contusions are the result of an injury that caused bleeding under the skin. The contusion may turn blue, purple, or yellow. Minor injuries will give you a painless contusion, but more severe contusions may stay painful and swollen for a few weeks.  °CAUSES  °A facial or scalp contusion is caused by a blunt injury or trauma to the face or head area.  °SIGNS AND SYMPTOMS  °· Swelling of the injured area.   °· Discoloration of the injured area.   °· Tenderness, soreness, or pain in the injured area.   °DIAGNOSIS  °The diagnosis can be made by taking a medical history and doing a physical exam. An X-ray exam, CT scan, or MRI may be needed to determine if there are any associated injuries, such as broken bones (fractures). °TREATMENT  °Often, the best treatment for a facial or scalp contusion is applying cold compresses to the injured area. Over-the-counter medicines may also be recommended for pain control.  °HOME CARE INSTRUCTIONS  °· Only take over-the-counter or prescription medicines as directed by your health care provider.   °· Apply ice to the injured area.   °· Put ice in a plastic bag.   °· Place a towel between your skin and the bag.   °· Leave the ice on for 20 minutes, 2 3 times a day.   °SEEK MEDICAL CARE IF: °· You have bite problems.   °· You have pain with chewing.   °· You are concerned about facial defects. °SEEK IMMEDIATE MEDICAL CARE IF: °· You have severe pain or a headache that is not relieved by medicine.   °· You have unusual sleepiness, confusion, or personality changes.   °· You throw up (vomit).   °· You have a persistent nosebleed.   °· You have double vision or blurred vision.   °· You have fluid drainage from your nose or ear.   °· You have difficulty walking or using your arms or legs.   °MAKE SURE YOU:   °· Understand these instructions. °· Will watch your condition. °· Will get help right away if you are not doing well or get worse. °Document Released: 12/16/2004 Document Revised: 08/29/2013 Document Reviewed: 06/21/2013 °ExitCare® Patient Information ©2014 [employer]. ° °Fall Prevention and Home Safety °Falls cause injuries and can affect all age groups. It is possible to use preventive measures to significantly decrease the likelihood of falls. There are many simple measures which can make your home safer and prevent falls. °OUTDOORS °· Repair cracks and edges of walkways and driveways. °· Remove high doorway thresholds. °· Trim shrubbery on the main path into your home. °· Have good outside lighting. °· Clear walkways of tools, rocks, debris, and clutter. °· Check that handrails are not broken and are securely fastened. Both sides of steps should have handrails. °· Have leaves, snow, and ice cleared regularly. °· Use sand or salt on walkways during winter months. °· In the garage, clean up grease or oil spills. °BATHROOM °· Install night lights. °· Install grab bars by the toilet and in the tub and shower. °· Use non-skid mats or decals in the tub or shower. °· Place a plastic non-slip stool in the shower to sit on, if needed. °· Keep floors dry and clean up all water on the floor immediately. °· Remove soap buildup in the tub or shower on a regular basis. °· Secure bath   mats with non-slip, double-sided rug tape. °· Remove throw rugs and tripping hazards from the floors. °BEDROOMS °· Install night lights. °· Make sure a bedside light is easy to reach. °· Do not use oversized bedding. °· Keep a telephone by your bedside. °· Have a firm chair with side arms to use for getting dressed. °· Remove throw rugs and tripping hazards from the floor. °KITCHEN °· Keep handles on pots and pans turned toward the center of the stove. Use back burners when possible. °· Clean up spills quickly and allow time for  drying. °· Avoid walking on wet floors. °· Avoid hot utensils and knives. °· Position shelves so they are not too high or low. °· Place commonly used objects within easy reach. °· If necessary, use a sturdy step stool with a grab bar when reaching. °· Keep electrical cables out of the way. °· Do not use floor polish or wax that makes floors slippery. If you must use wax, use non-skid floor wax. °· Remove throw rugs and tripping hazards from the floor. °STAIRWAYS °· Never leave objects on stairs. °· Place handrails on both sides of stairways and use them. Fix any loose handrails. Make sure handrails on both sides of the stairways are as long as the stairs. °· Check carpeting to make sure it is firmly attached along stairs. Make repairs to worn or loose carpet promptly. °· Avoid placing throw rugs at the top or bottom of stairways, or properly secure the rug with carpet tape to prevent slippage. Get rid of throw rugs, if possible. °· Have an electrician put in a light switch at the top and bottom of the stairs. °OTHER FALL PREVENTION TIPS °· Wear low-heel or rubber-soled shoes that are supportive and fit well. Wear closed toe shoes. °· When using a stepladder, make sure it is fully opened and both spreaders are firmly locked. Do not climb a closed stepladder. °· Add color or contrast paint or tape to grab bars and handrails in your home. Place contrasting color strips on first and last steps. °· Learn and use mobility aids as needed. Install an electrical emergency response system. °· Turn on lights to avoid dark areas. Replace light bulbs that burn out immediately. Get light switches that glow. °· Arrange furniture to create clear pathways. Keep furniture in the same place. °· Firmly attach carpet with non-skid or double-sided tape. °· Eliminate uneven floor surfaces. °· Select a carpet pattern that does not visually hide the edge of steps. °· Be aware of all pets. °OTHER HOME SAFETY TIPS °· Set the water temperature  for 120° F (48.8° C). °· Keep emergency numbers on or near the telephone. °· Keep smoke detectors on every level of the home and near sleeping areas. °Document Released: 10/29/2002 Document Revised: 05/09/2012 Document Reviewed: 01/28/2012 °ExitCare® Patient Information ©2014 [employer]. ° °

## 2013-12-14 NOTE — ED Notes (Signed)
Patient transported to CT 

## 2013-12-14 NOTE — ED Notes (Signed)
The shoe of one foot caught on the other, then did a half turn according to son, and fell and hit back of head. No loc. Big lump on back of head.. On coumadin.

## 2013-12-14 NOTE — ED Provider Notes (Signed)
CSN: [id number]     Arrival date & time 12/14/13  0102 History   First MD Initiated Contact with Patient 12/14/13 0145     Chief Complaint  Patient presents with  . Fall   (Consider location/radiation/quality/duration/timing/severity/associated sxs/prior Treatment) HPI 80 yo female presents to the ER from home via EMS after fall.  Pt was getting ready for bed when she tripped, striking the back of her head.  No LOC.  Pt is on coumadin.  She has large contusion to left side of head.  No confusion, dizziness.  No neck pain.  No other injuries reported.  Pt is not sure why she is on coumadin, but family thinks she had irregular heartbeat.  Unknown most recent inr Past Medical History  Diagnosis Date  . Arthritis   . Hypertension   . Thyroid disease   . Diabetes mellitus without complication   . CHF (congestive heart failure)   . Asthma    Past Surgical History  Procedure Laterality Date  . Coronary stent placement     No family history on file. History  Substance Use Topics  . Smoking status: Never Smoker   . Smokeless tobacco: Not on file  . Alcohol Use: No   OB History   Grav Para Term Preterm Abortions TAB SAB Ect Mult Living                 Review of Systems  All other systems reviewed and are negative.  other than listed in hpi  Allergies  Doxycycline  Home Medications   Current Outpatient Rx  Name  Route  Sig  Dispense  Refill  . amLODipine-benazepril (LOTREL) 5-20 MG per capsule   Oral   Take 1 capsule by mouth daily.         Marland Kitchen amoxicillin-clavulanate (AUGMENTIN) 500-125 MG per tablet   Oral   Take 1 tablet (500 mg total) by mouth 3 (three) times daily.   21 tablet   0   . atorvastatin (LIPITOR) 40 MG tablet   Oral   Take 40 mg by mouth daily.         Marland Kitchen doxycycline (VIBRAMYCIN) 100 MG capsule   Oral   Take 1 capsule (100 mg total) by mouth 2 (two) times daily.   20 capsule   0   . fexofenadine (ALLEGRA) 180 MG tablet   Oral   Take 180 mg by  mouth daily.         . fish oil-omega-3 fatty acids 1000 MG capsule   Oral   Take 2 g by mouth 2 (two) times daily.         . furosemide (LASIX) 40 MG tablet   Oral   Take 40 mg by mouth.         . gabapentin (NEURONTIN) 100 MG capsule   Oral   Take 300 mg by mouth 3 (three) times daily.         Marland Kitchen glipiZIDE (GLUCOTROL) 10 MG tablet   Oral   Take 10 mg by mouth daily.         Marland Kitchen glucosamine-chondroitin 500-400 MG tablet   Oral   Take 1 tablet by mouth 3 (three) times daily.         Marland Kitchen HYDROcodone-acetaminophen (NORCO/VICODIN) 5-325 MG per tablet   Oral   Take 0.5-1 tablets by mouth every 6 (six) hours as needed for pain.          Marland Kitchen levothyroxine (SYNTHROID, LEVOTHROID) 75 MCG tablet  Oral   Take 75 mcg by mouth at bedtime.         . metFORMIN (GLUCOPHAGE) 500 MG tablet   Oral   Take 500 mg by mouth 2 (two) times daily with a meal.         . metoprolol succinate (TOPROL-XL) 25 MG 24 hr tablet   Oral   Take 25 mg by mouth daily.         Marland Kitchen. ofloxacin (FLOXIN) 0.3 % otic solution   Both Ears   Place 5 drops into both ears daily as needed (ear pain).         . potassium chloride SA (K-DUR,KLOR-CON) 20 MEQ tablet   Oral   Take 20 mEq by mouth 2 (two) times daily.         . sitaGLIPtin (JANUVIA) 100 MG tablet   Oral   Take 100 mg by mouth daily.         Marland Kitchen. warfarin (COUMADIN) 7.5 MG tablet   Oral   Take 3.75-7.5 mg by mouth every morning. Takes 7.5 mg on Monday, Wednesday, Friday, Saturday and Sunday.  Takes 3.75 mg on Tuesday and Thursday.          BP 162/62  Pulse 60  Temp(Src) 98.1 F (36.7 C) (Oral)  Resp 18  SpO2 96% Physical Exam  Nursing note and vitals reviewed. Constitutional: She is oriented to person, place, and time. She appears well-developed and well-nourished.  HENT:  Head: Normocephalic.  Right Ear: External ear normal.  Left Ear: External ear normal.  Nose: Nose normal.  Mouth/Throat: Oropharynx is clear and moist.   Large left scalp contusion  Eyes: Conjunctivae and EOM are normal. Pupils are equal, round, and reactive to light.  Neck: Normal range of motion. Neck supple. No JVD present. No tracheal deviation present. No thyromegaly present.  Cardiovascular: Normal rate, regular rhythm, normal heart sounds and intact distal pulses.  Exam reveals no gallop and no friction rub.   No murmur heard. Pulmonary/Chest: Effort normal and breath sounds normal. No stridor. No respiratory distress. She has no wheezes. She has no rales. She exhibits no tenderness.  Abdominal: Soft. Bowel sounds are normal. She exhibits no distension and no mass. There is no tenderness. There is no rebound and no guarding.  Musculoskeletal: Normal range of motion. She exhibits no edema and no tenderness.  Lymphadenopathy:    She has no cervical adenopathy.  Neurological: She is alert and oriented to person, place, and time. She has normal reflexes. No cranial nerve deficit. She exhibits normal muscle tone. Coordination normal.  Skin: Skin is warm and dry. No rash noted. No erythema. No pallor.  Psychiatric: She has a normal mood and affect. Her behavior is normal. Judgment and thought content normal.    ED Course  Procedures (including critical care time) Labs Review Labs Reviewed  PROTIME-INR - Abnormal; Notable for the following:    Prothrombin Time 18.5 (*)    INR 1.59 (*)    All other components within normal limits  CBC WITH DIFFERENTIAL - Abnormal; Notable for the following:    Eosinophils Relative 9 (*)    All other components within normal limits  BASIC METABOLIC PANEL - Abnormal; Notable for the following:    Glucose, Bld 142 (*)    BUN 28 (*)    GFR calc non Af Amer 48 (*)    GFR calc Af Amer 55 (*)    All other components within normal limits   Imaging Review  Ct Head Wo Contrast  12/14/2013   CLINICAL DATA:  Fall, headache, no neck pain  EXAM: CT HEAD WITHOUT CONTRAST  CT CERVICAL SPINE WITHOUT CONTRAST   TECHNIQUE: Multidetector CT imaging of the head and cervical spine was performed following the standard protocol without intravenous contrast. Multiplanar CT image reconstructions of the cervical spine were also generated.  COMPARISON:  None available.  FINDINGS: CT HEAD FINDINGS  Large left parietal cephalohematoma is present. The underlying calvarium is intact. No skull fracture identified.  Diffuse prominence of the CSF containing spaces is compatible with generalized age-related atrophy. Moderate chronic microvascular ischemic changes are noted. No acute intracranial hemorrhage or infarct. No mass or midline shift. No hydrocephalus. No extra-axial fluid collection identified.  Orbits are within normal limits.  Paranasal sinuses and mastoid air cells are clear.  CT CERVICAL SPINE FINDINGS  There is trace anterolisthesis of C4 on C5. Otherwise, the vertebral bodies are normally aligned with preservation of the normal cervical lordosis. Vertebral body heights are preserved. Normal C1-2 articulations are intact. No prevertebral soft tissue swelling. No acute fracture or listhesis. Linear lucency traversing the left lateral mass of C4 on the coronal sequence is demonstrates a fairly sclerotic margin, and is likely chronic in nature.  Multilevel degenerative disc disease as evidenced by intervertebral disc space narrowing, endplate sclerosis, and endplate osteophytosis is present, most severe at C5-6. Multilevel facet arthrosis is noted. Most severe at the C2-3 level on the left.  Left thyroid lobe is enlarged with heterogeneous calcifications. Prominent vascular calcifications noted about the carotid bifurcations. Visualized lung apices are clear.  IMPRESSION: CT BRAIN:  1. Left parietal cephalohematoma with no acute intracranial abnormality identified. 2. It is age-related atrophy and chronic microvascular ischemic disease.  CT CERVICAL SPINE:  1. No acute fracture or traumatic malalignment within the cervical  spine. 2. Multilevel degenerative disc disease and facet arthrosis, most severe at C5-6.   Electronically Signed   By: Rise Mu M.D.   On: 12/14/2013 02:35   Ct Cervical Spine Wo Contrast  12/14/2013   CLINICAL DATA:  Fall, headache, no neck pain  EXAM: CT HEAD WITHOUT CONTRAST  CT CERVICAL SPINE WITHOUT CONTRAST  TECHNIQUE: Multidetector CT imaging of the head and cervical spine was performed following the standard protocol without intravenous contrast. Multiplanar CT image reconstructions of the cervical spine were also generated.  COMPARISON:  None available.  FINDINGS: CT HEAD FINDINGS  Large left parietal cephalohematoma is present. The underlying calvarium is intact. No skull fracture identified.  Diffuse prominence of the CSF containing spaces is compatible with generalized age-related atrophy. Moderate chronic microvascular ischemic changes are noted. No acute intracranial hemorrhage or infarct. No mass or midline shift. No hydrocephalus. No extra-axial fluid collection identified.  Orbits are within normal limits.  Paranasal sinuses and mastoid air cells are clear.  CT CERVICAL SPINE FINDINGS  There is trace anterolisthesis of C4 on C5. Otherwise, the vertebral bodies are normally aligned with preservation of the normal cervical lordosis. Vertebral body heights are preserved. Normal C1-2 articulations are intact. No prevertebral soft tissue swelling. No acute fracture or listhesis. Linear lucency traversing the left lateral mass of C4 on the coronal sequence is demonstrates a fairly sclerotic margin, and is likely chronic in nature.  Multilevel degenerative disc disease as evidenced by intervertebral disc space narrowing, endplate sclerosis, and endplate osteophytosis is present, most severe at C5-6. Multilevel facet arthrosis is noted. Most severe at the C2-3 level on the left.  Left thyroid lobe is enlarged  with heterogeneous calcifications. Prominent vascular calcifications noted about the  carotid bifurcations. Visualized lung apices are clear.  IMPRESSION: CT BRAIN:  1. Left parietal cephalohematoma with no acute intracranial abnormality identified. 2. It is age-related atrophy and chronic microvascular ischemic disease.  CT CERVICAL SPINE:  1. No acute fracture or traumatic malalignment within the cervical spine. 2. Multilevel degenerative disc disease and facet arthrosis, most severe at C5-6.   Electronically Signed   By: Rise Mu M.D.   On: 12/14/2013 02:35    EKG Interpretation   None       MDM   1. Fall at home   2. Scalp contusion   3. Anticoagulated on Coumadin        Katelyn Mackie, MD 12/14/13 [id number]

## 2014-09-16 ENCOUNTER — Emergency Department (HOSPITAL_COMMUNITY): Payer: Medicare Other

## 2014-09-16 ENCOUNTER — Encounter (HOSPITAL_COMMUNITY): Payer: Self-pay | Admitting: Emergency Medicine

## 2014-09-16 ENCOUNTER — Emergency Department (HOSPITAL_COMMUNITY)
Admission: EM | Admit: 2014-09-16 | Discharge: 2014-09-16 | Disposition: A | Discharge status: Home / Self Care | Payer: Medicare Other | Attending: Emergency Medicine | Admitting: Emergency Medicine

## 2014-09-16 DIAGNOSIS — M1990 Unspecified osteoarthritis, unspecified site: Secondary | ICD-10-CM | POA: Insufficient documentation

## 2014-09-16 DIAGNOSIS — E119 Type 2 diabetes mellitus without complications: Secondary | ICD-10-CM | POA: Diagnosis not present

## 2014-09-16 DIAGNOSIS — Z791 Long term (current) use of non-steroidal anti-inflammatories (NSAID): Secondary | ICD-10-CM | POA: Diagnosis not present

## 2014-09-16 DIAGNOSIS — T1490XA Injury, unspecified, initial encounter: Secondary | ICD-10-CM

## 2014-09-16 DIAGNOSIS — S6991XA Unspecified injury of right wrist, hand and finger(s), initial encounter: Secondary | ICD-10-CM

## 2014-09-16 DIAGNOSIS — Z880 Allergy status to penicillin: Secondary | ICD-10-CM | POA: Diagnosis not present

## 2014-09-16 DIAGNOSIS — I509 Heart failure, unspecified: Secondary | ICD-10-CM | POA: Diagnosis not present

## 2014-09-16 DIAGNOSIS — I10 Essential (primary) hypertension: Secondary | ICD-10-CM | POA: Insufficient documentation

## 2014-09-16 DIAGNOSIS — W19XXXA Unspecified fall, initial encounter: Secondary | ICD-10-CM

## 2014-09-16 DIAGNOSIS — Z79899 Other long term (current) drug therapy: Secondary | ICD-10-CM | POA: Diagnosis not present

## 2014-09-16 DIAGNOSIS — S62114A Nondisplaced fracture of triquetrum [cuneiform] bone, right wrist, initial encounter for closed fracture: Secondary | ICD-10-CM | POA: Diagnosis not present

## 2014-09-16 DIAGNOSIS — Y9389 Activity, other specified: Secondary | ICD-10-CM | POA: Insufficient documentation

## 2014-09-16 DIAGNOSIS — Z9861 Coronary angioplasty status: Secondary | ICD-10-CM | POA: Diagnosis not present

## 2014-09-16 DIAGNOSIS — Y9289 Other specified places as the place of occurrence of the external cause: Secondary | ICD-10-CM | POA: Insufficient documentation

## 2014-09-16 DIAGNOSIS — Z7901 Long term (current) use of anticoagulants: Secondary | ICD-10-CM | POA: Diagnosis not present

## 2014-09-16 DIAGNOSIS — Z792 Long term (current) use of antibiotics: Secondary | ICD-10-CM | POA: Insufficient documentation

## 2014-09-16 DIAGNOSIS — J45909 Unspecified asthma, uncomplicated: Secondary | ICD-10-CM | POA: Insufficient documentation

## 2014-09-16 DIAGNOSIS — E079 Disorder of thyroid, unspecified: Secondary | ICD-10-CM | POA: Diagnosis not present

## 2014-09-16 DIAGNOSIS — W01198A Fall on same level from slipping, tripping and stumbling with subsequent striking against other object, initial encounter: Secondary | ICD-10-CM | POA: Diagnosis not present

## 2014-09-16 DIAGNOSIS — S62111A Displaced fracture of triquetrum [cuneiform] bone, right wrist, initial encounter for closed fracture: Secondary | ICD-10-CM

## 2014-09-16 MED ORDER — NAPROXEN 500 MG PO TABS: 500.0000 mg | ORAL_TABLET | Freq: Two times a day (BID) | ORAL | Status: DC

## 2014-09-16 MED ORDER — NAPROXEN 250 MG PO TABS
500.0000 mg | ORAL_TABLET | Freq: Once | ORAL | Status: AC
  Administered 2014-09-16: 500 mg via ORAL
  Filled 2014-09-16: qty 2

## 2014-09-16 NOTE — ED Notes (Signed)
Patient reports she fell today and her right arm landed in a trash can. Patient is complaining of right hand pain.

## 2014-09-16 NOTE — ED Provider Notes (Signed)
CSN: [id number]     Arrival date & time 09/16/14  2014 History   First MD Initiated Contact with Patient 09/16/14 2035     Chief Complaint  Patient presents with  . Hand Injury     (Consider location/radiation/quality/duration/timing/severity/associated sxs/prior Treatment) HPI Comments: 80 year old female presents after falling and striking her right hand, she states it hit a trashcan when she slipped and fell to the ground. She denies any other injuries including head pain, neck pain or head pain. Her symptoms were acute in onset at 3:00 PM when she fell. They are persistent, mild, not associated with swelling deformity numbness or weakness. She has applied ice and taken half of a Vicodin prior to arrival.  Patient is a 80 y.o. female presenting with hand injury. The history is provided by the patient and a relative.  Hand Injury Associated symptoms: no back pain and no neck pain     Past Medical History  Diagnosis Date  . Arthritis   . Hypertension   . Thyroid disease   . Diabetes mellitus without complication   . CHF (congestive heart failure)   . Asthma    Past Surgical History  Procedure Laterality Date  . Coronary stent placement     History reviewed. No pertinent family history. History  Substance Use Topics  . Smoking status: Never Smoker   . Smokeless tobacco: Not on file  . Alcohol Use: No   OB History   Grav Para Term Preterm Abortions TAB SAB Ect Mult Living                 Review of Systems  Gastrointestinal: Negative for nausea and vomiting.  Musculoskeletal: Negative for back pain, joint swelling and neck pain.  Skin: Negative for rash and wound.  Neurological: Negative for weakness and numbness.      Allergies  Doxycycline and Penicillins  Home Medications   Prior to Admission medications   Medication Sig Start Date End Date Taking? Authorizing Provider  amLODipine-benazepril (LOTREL) 5-20 MG per capsule Take 1 capsule by mouth every  morning.    Yes Historical Provider, MD  atorvastatin (LIPITOR) 40 MG tablet Take 40 mg by mouth every morning.    Yes Historical Provider, MD  fexofenadine (ALLEGRA) 180 MG tablet Take 180 mg by mouth daily as needed for allergies.    Yes Historical Provider, MD  fish oil-omega-3 fatty acids 1000 MG capsule Take 2 g by mouth 2 (two) times daily.   Yes Historical Provider, MD  furosemide (LASIX) 40 MG tablet Take 40 mg by mouth every morning.    Yes Historical Provider, MD  gabapentin (NEURONTIN) 100 MG capsule Take 300 mg by mouth 3 (three) times daily.   Yes Historical Provider, MD  glipiZIDE (GLUCOTROL) 10 MG tablet Take 10 mg by mouth every morning.    Yes Historical Provider, MD  HYDROcodone-acetaminophen (NORCO/VICODIN) 5-325 MG per tablet Take 0.5-1 tablets by mouth 2 (two) times daily.    Yes Historical Provider, MD  HYDROXYZINE HCL PO Take 1 tablet by mouth 2 (two) times daily.   Yes Historical Provider, MD  levothyroxine (SYNTHROID, LEVOTHROID) 75 MCG tablet Take 75 mcg by mouth at bedtime.   Yes Historical Provider, MD  metFORMIN (GLUCOPHAGE) 500 MG tablet Take 500 mg by mouth 2 (two) times daily with a meal.   Yes Historical Provider, MD  metoprolol succinate (TOPROL-XL) 25 MG 24 hr tablet Take 25 mg by mouth every morning.    Yes Historical Provider, MD  potassium chloride SA (K-DUR,KLOR-CON) 20 MEQ tablet Take 20 mEq by mouth 2 (two) times daily.   Yes Historical Provider, MD  PRESCRIPTION MEDICATION Take 1 capsule by mouth every morning. TO PREVENT BLADDER INFECTIONS   Yes Historical Provider, MD  sitaGLIPtin (JANUVIA) 100 MG tablet Take 50 mg by mouth every morning.    Yes Historical Provider, MD  warfarin (COUMADIN) 7.5 MG tablet Take 7.5 mg by mouth every morning.    Yes Historical Provider, MD  azithromycin (ZITHROMAX) 250 MG tablet Take 250-500 mg by mouth See admin instructions. Take as directed per package instructions: Take two tablets on day 1 then take one tablet daily for 4  days    Historical Provider, MD  naproxen (NAPROSYN) 500 MG tablet Take 1 tablet (500 mg total) by mouth 2 (two) times daily with a meal. 09/16/14   Vida RollerBrian D Breyona Swander, MD   BP 129/56  Pulse 66  Temp(Src) 98 F (36.7 C) (Oral)  Resp 16  Ht 5\' 2"  (1.575 m)  Wt 175 lb (79.379 kg)  BMI 32.00 kg/m2  SpO2 96% Physical Exam  Nursing note and vitals reviewed. Constitutional: She appears well-developed and well-nourished. No distress.  HENT:  Head: Normocephalic and atraumatic.  Eyes: Conjunctivae are normal. No scleral icterus.  Cardiovascular: Normal rate, regular rhythm and intact distal pulses.   Pulmonary/Chest: Effort normal and breath sounds normal.  Musculoskeletal: She exhibits tenderness ( Over the right wrist, no snuffbox tenderness, slight decreased range of motion secondary to pain, no obvious swelling or deformity, normal grip). She exhibits no edema.  Neurological: She is alert.  Skin: Skin is warm and dry. No rash noted. She is not diaphoretic.    ED Course  Procedures (including critical care time) Labs Review Labs Reviewed - No data to display  Imaging Review Dg Wrist Complete Right  09/16/2014   CLINICAL DATA:  Patient knocked over by her dog this morning, falling and injuring her right hand and wrist.  EXAM: RIGHT WRIST - COMPLETE 3+ VIEW  COMPARISON:  None.  FINDINGS: Evidence of a small nondisplaced avulsion fracture from the dorsal aspect of the triquetrum on the lateral view only.  No other evidence of a fracture. No dislocation. Mild widening of the scaphoid lunate interval. There is narrowing, mild subchondral sclerosis and small marginal osteophytes at the scaphoid, trapezium, trapezoid articulations. No other arthropathic change.  Bones are demineralized.  Mild dorsal soft tissue swelling.  IMPRESSION: 1. Small nondisplaced avulsion fracture from the dorsal aspect of the triquetrum. 2. No other fractures.  No dislocation.   Electronically Signed   By: Amie Portlandavid  Ormond  M.D.   On: 09/16/2014 21:09      MDM   Final diagnoses:  Trauma  Triquetral chip fracture, right, closed, initial encounter    Overall the patient is well-appearing, she has only minor injuries, vital signs normal, x-rays pending, ice, elevation  Imagine shows small chip triquetral frx, pt informed, splint, RICE home  Meds given in ED:  Medications - No data to display  New Prescriptions   NAPROXEN (NAPROSYN) 500 MG TABLET    Take 1 tablet (500 mg total) by mouth 2 (two) times daily with a meal.      Vida RollerBrian D Lara Palinkas, MD 09/16/14 2243

## 2014-09-16 NOTE — Discharge Instructions (Signed)
You have a chip fracture of your Triquetrum.  This is a wrist bone - it will not need surgery - you should see the orthopedist in 1 week for recheck.  If you do not have one, you may see Dr. Romeo AppleHarrison above - call for appointment today.  Naprosyn for pain  RICE therapy

## 2015-05-29 ENCOUNTER — Emergency Department (HOSPITAL_COMMUNITY)
Admission: EM | Admit: 2015-05-29 | Discharge: 2015-05-30 | Disposition: A | Discharge status: Home / Self Care | Payer: Medicare Other | Attending: Emergency Medicine | Admitting: Emergency Medicine

## 2015-05-29 ENCOUNTER — Emergency Department (HOSPITAL_COMMUNITY): Payer: Medicare Other

## 2015-05-29 ENCOUNTER — Encounter (HOSPITAL_COMMUNITY): Payer: Self-pay | Admitting: Emergency Medicine

## 2015-05-29 DIAGNOSIS — Z955 Presence of coronary angioplasty implant and graft: Secondary | ICD-10-CM | POA: Insufficient documentation

## 2015-05-29 DIAGNOSIS — S90811A Abrasion, right foot, initial encounter: Secondary | ICD-10-CM | POA: Insufficient documentation

## 2015-05-29 DIAGNOSIS — Y9389 Activity, other specified: Secondary | ICD-10-CM | POA: Diagnosis not present

## 2015-05-29 DIAGNOSIS — R4182 Altered mental status, unspecified: Secondary | ICD-10-CM | POA: Diagnosis present

## 2015-05-29 DIAGNOSIS — E079 Disorder of thyroid, unspecified: Secondary | ICD-10-CM | POA: Insufficient documentation

## 2015-05-29 DIAGNOSIS — K429 Umbilical hernia without obstruction or gangrene: Secondary | ICD-10-CM | POA: Diagnosis not present

## 2015-05-29 DIAGNOSIS — I10 Essential (primary) hypertension: Secondary | ICD-10-CM | POA: Diagnosis not present

## 2015-05-29 DIAGNOSIS — I509 Heart failure, unspecified: Secondary | ICD-10-CM | POA: Insufficient documentation

## 2015-05-29 DIAGNOSIS — S50812A Abrasion of left forearm, initial encounter: Secondary | ICD-10-CM | POA: Insufficient documentation

## 2015-05-29 DIAGNOSIS — Y9289 Other specified places as the place of occurrence of the external cause: Secondary | ICD-10-CM | POA: Diagnosis not present

## 2015-05-29 DIAGNOSIS — W1839XA Other fall on same level, initial encounter: Secondary | ICD-10-CM | POA: Diagnosis not present

## 2015-05-29 DIAGNOSIS — Z7901 Long term (current) use of anticoagulants: Secondary | ICD-10-CM | POA: Diagnosis not present

## 2015-05-29 DIAGNOSIS — M1990 Unspecified osteoarthritis, unspecified site: Secondary | ICD-10-CM | POA: Insufficient documentation

## 2015-05-29 DIAGNOSIS — N390 Urinary tract infection, site not specified: Secondary | ICD-10-CM | POA: Diagnosis not present

## 2015-05-29 DIAGNOSIS — W19XXXA Unspecified fall, initial encounter: Secondary | ICD-10-CM

## 2015-05-29 DIAGNOSIS — E119 Type 2 diabetes mellitus without complications: Secondary | ICD-10-CM | POA: Diagnosis not present

## 2015-05-29 DIAGNOSIS — Y998 Other external cause status: Secondary | ICD-10-CM | POA: Insufficient documentation

## 2015-05-29 DIAGNOSIS — J45909 Unspecified asthma, uncomplicated: Secondary | ICD-10-CM | POA: Diagnosis not present

## 2015-05-29 DIAGNOSIS — Z86711 Personal history of pulmonary embolism: Secondary | ICD-10-CM | POA: Diagnosis not present

## 2015-05-29 DIAGNOSIS — Z79899 Other long term (current) drug therapy: Secondary | ICD-10-CM | POA: Diagnosis not present

## 2015-05-29 LAB — URINALYSIS, ROUTINE W REFLEX MICROSCOPIC
BILIRUBIN URINE: NEGATIVE
GLUCOSE, UA: NEGATIVE mg/dL
KETONES UR: NEGATIVE mg/dL
Nitrite: POSITIVE — AB
PH: 5.5 (ref 5.0–8.0)
PROTEIN: NEGATIVE mg/dL
Specific Gravity, Urine: 1.02 (ref 1.005–1.030)
UROBILINOGEN UA: 0.2 mg/dL (ref 0.0–1.0)

## 2015-05-29 LAB — CBC WITH DIFFERENTIAL/PLATELET
Basophils Absolute: 0 10*3/uL (ref 0.0–0.1)
Basophils Relative: 0 % (ref 0–1)
Eosinophils Absolute: 0.8 10*3/uL — ABNORMAL HIGH (ref 0.0–0.7)
Eosinophils Relative: 10 % — ABNORMAL HIGH (ref 0–5)
HCT: 38.7 % (ref 36.0–46.0)
HEMOGLOBIN: 12.5 g/dL (ref 12.0–15.0)
LYMPHS ABS: 2.4 10*3/uL (ref 0.7–4.0)
LYMPHS PCT: 27 % (ref 12–46)
MCH: 29.6 pg (ref 26.0–34.0)
MCHC: 32.3 g/dL (ref 30.0–36.0)
MCV: 91.7 fL (ref 78.0–100.0)
Monocytes Absolute: 1 10*3/uL (ref 0.1–1.0)
Monocytes Relative: 11 % (ref 3–12)
NEUTROS ABS: 4.5 10*3/uL (ref 1.7–7.7)
NEUTROS PCT: 52 % (ref 43–77)
PLATELETS: 242 10*3/uL (ref 150–400)
RBC: 4.22 MIL/uL (ref 3.87–5.11)
RDW: 14.3 % (ref 11.5–15.5)
WBC: 8.7 10*3/uL (ref 4.0–10.5)

## 2015-05-29 LAB — URINE MICROSCOPIC-ADD ON

## 2015-05-29 LAB — COMPREHENSIVE METABOLIC PANEL
ALK PHOS: 45 U/L (ref 38–126)
ALT: 23 U/L (ref 14–54)
AST: 22 U/L (ref 15–41)
Albumin: 3.3 g/dL — ABNORMAL LOW (ref 3.5–5.0)
Anion gap: 12 (ref 5–15)
BUN: 43 mg/dL — ABNORMAL HIGH (ref 6–20)
CALCIUM: 8.5 mg/dL — AB (ref 8.9–10.3)
CO2: 26 mmol/L (ref 22–32)
Chloride: 101 mmol/L (ref 101–111)
Creatinine, Ser: 1.52 mg/dL — ABNORMAL HIGH (ref 0.44–1.00)
GFR, EST AFRICAN AMERICAN: 33 mL/min — AB (ref >60)
GFR, EST NON AFRICAN AMERICAN: 28 mL/min — AB (ref >60)
Glucose, Bld: 139 mg/dL — ABNORMAL HIGH (ref 65–99)
Potassium: 4.6 mmol/L (ref 3.5–5.1)
SODIUM: 139 mmol/L (ref 135–145)
Total Bilirubin: 0.5 mg/dL (ref 0.3–1.2)
Total Protein: 7.4 g/dL (ref 6.5–8.1)

## 2015-05-29 LAB — PROTIME-INR
INR: 2.37 — ABNORMAL HIGH (ref 0.00–1.49)
PROTHROMBIN TIME: 25.6 s — AB (ref 11.6–15.2)

## 2015-05-29 LAB — TROPONIN I: Troponin I: <0.03 ng/mL (ref <0.031)

## 2015-05-29 MED ORDER — DEXTROSE 5 % IV SOLN
1.0000 g | Freq: Once | INTRAVENOUS | Status: AC
  Administered 2015-05-29: 1 g via INTRAVENOUS
  Filled 2015-05-29: qty 10

## 2015-05-29 MED ORDER — CEPHALEXIN 500 MG PO CAPS: 500.0000 mg | ORAL_CAPSULE | Freq: Three times a day (TID) | ORAL | Status: AC

## 2015-05-29 MED ORDER — SODIUM CHLORIDE 0.9 % IV SOLN
Freq: Once | INTRAVENOUS | Status: AC
  Administered 2015-05-29: 23:00:00 via INTRAVENOUS

## 2015-05-29 NOTE — ED Notes (Signed)
MD at bedside. 

## 2015-05-29 NOTE — ED Provider Notes (Signed)
CSN: [id number]     Arrival date & time 05/29/15  1919 History   First MD Initiated Contact with Patient 05/29/15 1951     Chief Complaint  Patient presents with  . Altered Mental Status     (Consider location/radiation/quality/duration/timing/severity/associated sxs/prior Treatment) HPI Comments: Granddaughter states patient has increasing confusion for the past 2 days it waxes and wanes. She had a fall today we'll try to go up the stairs but did not hit her head. She is an abrasion to left forearm. Patient is oriented to person and place at this time. She is disoriented to time. She denies any headache, neck pain, chest pain or shortness of breath. No abdominal pain. No document of fevers. No pain with urination. No recent medication changes. Patient with some abrasions to her right ankle daughter's concern for cellulitis. Patient with history of diabetes, CHF, hypertension. She is on Coumadin for history of pulmonary embolism. Good by mouth intake and urine output. No fever. No cough or urinary symptoms. Granddaughter states patient earlier today was looking for the granddaughter did not recognize her.  The history is provided by a relative and the patient. The history is limited by the condition of the patient.    Past Medical History  Diagnosis Date  . Arthritis   . Hypertension   . Thyroid disease   . Diabetes mellitus without complication   . CHF (congestive heart failure)   . Asthma    Past Surgical History  Procedure Laterality Date  . Coronary stent placement     No family history on file. History  Substance Use Topics  . Smoking status: Never Smoker   . Smokeless tobacco: Not on file  . Alcohol Use: No   OB History    No data available     Review of Systems  Unable to perform ROS: Mental status change  Constitutional: Negative for fever, activity change and appetite change.      Allergies  Doxycycline and Ultram  Home Medications   Prior to Admission  medications   Medication Sig Start Date End Date Taking? Authorizing Provider  amLODipine-benazepril (LOTREL) 5-20 MG per capsule Take 1 capsule by mouth every morning.    Yes Historical Provider, MD  atorvastatin (LIPITOR) 40 MG tablet Take 40 mg by mouth every morning.    Yes Historical Provider, MD  furosemide (LASIX) 40 MG tablet Take 40 mg by mouth every morning.    Yes Historical Provider, MD  gabapentin (NEURONTIN) 100 MG capsule Take 300 mg by mouth 3 (three) times daily.   Yes Historical Provider, MD  glipiZIDE (GLUCOTROL) 10 MG tablet Take 10 mg by mouth 2 (two) times daily before a meal.    Yes Historical Provider, MD  HYDROcodone-acetaminophen (NORCO/VICODIN) 5-325 MG per tablet Take 0.5 tablets by mouth 4 (four) times daily as needed (knee pain).    Yes Historical Provider, MD  hydrOXYzine (ATARAX/VISTARIL) 10 MG tablet Take 10 mg by mouth 4 (four) times daily as needed for anxiety.   Yes Historical Provider, MD  levothyroxine (SYNTHROID, LEVOTHROID) 75 MCG tablet Take 75 mcg by mouth at bedtime.   Yes Historical Provider, MD  Melatonin 2.5 MG CAPS Take 2.5 mg by mouth at bedtime.   Yes Historical Provider, MD  metFORMIN (GLUCOPHAGE) 850 MG tablet Take 850 mg by mouth 2 (two) times daily with a meal.   Yes Historical Provider, MD  metoprolol succinate (TOPROL-XL) 25 MG 24 hr tablet Take 25 mg by mouth every morning.  Yes Historical Provider, MD  mirabegron ER (MYRBETRIQ) 25 MG TB24 tablet Take 25 mg by mouth daily.   Yes Historical Provider, MD  omega-3 acid ethyl esters (LOVAZA) 1 G capsule Take 3 g by mouth 2 (two) times daily.   Yes Historical Provider, MD  potassium chloride (MICRO-K) 10 MEQ CR capsule Take 10 mEq by mouth 2 (two) times daily.   Yes Historical Provider, MD  sitaGLIPtin (JANUVIA) 100 MG tablet Take 100 mg by mouth daily.    Yes Historical Provider, MD  warfarin (COUMADIN) 10 MG tablet Take 10 mg by mouth 2 (two) times a week.   Yes Historical Provider, MD   fexofenadine (ALLEGRA) 180 MG tablet Take 180 mg by mouth daily as needed for allergies.     Historical Provider, MD  naproxen (NAPROSYN) 500 MG tablet Take 1 tablet (500 mg total) by mouth 2 (two) times daily with a meal. Patient not taking: Reported on 05/29/2015 09/16/14   Eber HongBrian Miller, MD   BP 175/68 mmHg  Pulse 67  Temp(Src) 97.8 F (36.6 C) (Oral)  Resp 19  Ht 5\' 4"  (1.626 m)  SpO2 94% Physical Exam  Constitutional: She appears well-developed and well-nourished. No distress.  HENT:  Head: Normocephalic and atraumatic.  Mouth/Throat: Oropharynx is clear and moist. No oropharyngeal exudate.  Eyes: Conjunctivae and EOM are normal. Pupils are equal, round, and reactive to light.  Neck: Normal range of motion. Neck supple.  No C spine tenderness  Cardiovascular: Normal rate, regular rhythm and normal heart sounds.   No murmur heard. Pulmonary/Chest: Effort normal and breath sounds normal. No respiratory distress.  Abdominal: Soft. There is no tenderness. There is no rebound and no guarding.  Umbilical hernia on exam that is difficult to reduce.  Musculoskeletal: Normal range of motion. She exhibits no edema or tenderness.  Abrasion to R dorsal foot Abrasion L forearm  Neurological: She is alert. No cranial nerve deficit. She exhibits normal muscle tone. Coordination normal.  Oriented x2.  5/5 strength throughout, CN 2-12 intact.  Skin: Skin is warm.    ED Course  Procedures (including critical care time) Labs Review Labs Reviewed  CBC WITH DIFFERENTIAL/PLATELET - Abnormal; Notable for the following:    Eosinophils Relative 10 (*)    Eosinophils Absolute 0.8 (*)    All other components within normal limits  COMPREHENSIVE METABOLIC PANEL - Abnormal; Notable for the following:    Glucose, Bld 139 (*)    BUN 43 (*)    Creatinine, Ser 1.52 (*)    Calcium 8.5 (*)    Albumin 3.3 (*)    GFR calc non Af Amer 28 (*)    GFR calc Af Amer 33 (*)    All other components within  normal limits  PROTIME-INR - Abnormal; Notable for the following:    Prothrombin Time 25.6 (*)    INR 2.37 (*)    All other components within normal limits  TROPONIN I  URINALYSIS, ROUTINE W REFLEX MICROSCOPIC ([facility])    Imaging Review Ct Abdomen Pelvis Wo Contrast  05/29/2015   CLINICAL DATA:  81 year old who fell earlier today and now complains of periumbilical abdominal pain.  EXAM: CT ABDOMEN AND PELVIS WITHOUT CONTRAST  TECHNIQUE: Multidetector CT imaging of the abdomen and pelvis was performed following the standard protocol without IV contrast.  COMPARISON:  None.  FINDINGS: Normal unenhanced appearance of the liver, spleen, pancreas, adrenal glands, kidneys and gallbladder. No biliary ductal dilation. Focus of accessory splenic tissue anterior to  the spleen at the level of the hilum. Severe aortoiliofemoral atherosclerosis with ectasia of the infrarenal abdominal aorta measuring up to 2.7 cm diameter. No significant lymphadenopathy.  Stomach distended with food and fluid. Gas-filled diverticulum arising from the medial aspect of the 2nd portion of the duodenum. Small bowel otherwise unremarkable. Diffuse colonic diverticulosis without evidence of acute diverticulitis. Normal appendix in the right upper pelvis. No ascites.  Uterus atrophic consistent with age. No adnexal masses or free pelvic fluid. Urinary bladder unremarkable.  Bone window images demonstrate osseous demineralization, degenerative disc disease, spondylosis and facet degenerative changes at every lumbar level, mild lower thoracic spondylosis, and mild degenerative changes in both hips.  Visualized lung bases clear apart from scarring in the lingula and deep posterior lower lobes. Heart mildly enlarged with mitral annular calcification and 3 vessel coronary atherosclerosis.  IMPRESSION: 1. No acute abnormalities involving the abdomen or pelvis. 2. Diffuse colonic diverticulosis without evidence of acute diverticulitis. 3.  Diverticulum arising from the medial aspect of the 2nd portion of the duodenum. 4. Ectasia of the infrarenal abdominal aorta, maximum diameter 2.7 cm. Typically, 1 would recommend followup by ultrasound in 5 years, but this is likely unnecessary given the patient's age. (This recommendation follows ACR consensus guidelines: White Paper of the ACR Incidental Findings Committee II on Vascular Findings. J Am Coll Radiol 2013; 10:789-794).   Electronically Signed   By: Hulan Saas M.D.   On: 05/29/2015 21:54   Dg Chest 2 View  05/29/2015   CLINICAL DATA:  Confusion.  Patient fell earlier today  EXAM: CHEST  2 VIEW  COMPARISON:  None.  FINDINGS: Lungs are clear. Heart size and pulmonary vascularity are normal. There is atherosclerotic change in aorta. No adenopathy. No pneumothorax. No fractures are identified.  IMPRESSION: Lungs clear.  No demonstrable pneumothorax.   Electronically Signed   By: Bretta Bang III M.D.   On: 05/29/2015 21:51   Dg Forearm Left  05/29/2015   CLINICAL DATA:  Worsening confusion for 2 days. Status post fall, with left forearm abrasion. Initial encounter.  EXAM: LEFT FOREARM - 2 VIEW  COMPARISON:  None.  FINDINGS: The known soft tissue abrasion is difficult to fully characterize on radiograph  There is no evidence of fracture or dislocation. The radius and ulna appear grossly intact. Mild degenerative change is noted about the distal ulna. Negative ulnar variance is noted. The elbow joint is incompletely assessed, but appears grossly unremarkable. There is borderline widening of the scapholunate interval, measuring just under 3 mm.  IMPRESSION: No evidence of fracture or dislocation.   Electronically Signed   By: Roanna Raider M.D.   On: 05/29/2015 21:53   Dg Ankle Complete Right  05/29/2015   CLINICAL DATA:  Fall. Right ankle bruising on the lateral side. Initial encounter.  EXAM: RIGHT ANKLE - COMPLETE 3+ VIEW  COMPARISON:  None.  FINDINGS: Diffuse soft tissue swelling about  the ankle. No underlying fracture or dislocation. No significant degenerative change at the ankle.  IMPRESSION: No acute bony findings.   Electronically Signed   By: Marnee Spring M.D.   On: 05/29/2015 21:45   Ct Head Wo Contrast  05/29/2015   CLINICAL DATA:  81 year old female with confusion and fall.  EXAM: CT HEAD WITHOUT CONTRAST  TECHNIQUE: Contiguous axial images were obtained from the base of the skull through the vertex without intravenous contrast.  COMPARISON:  CT dated 12/14/2013  FINDINGS: The ventricles are dilated and the sulci are prominent compatible with age-related atrophy.  There is bifrontal volume loss. Periventricular and deep white matter hypodensities represent chronic microvascular ischemic changes. There is no intracranial hemorrhage. No mass effect or midline shift identified.  The visualized paranasal sinuses and mastoid air cells are well aerated. The calvarium is intact.  IMPRESSION: No acute intracranial pathology.  Age-related atrophy and chronic microvascular ischemic disease.  If symptoms persist and there are no contraindications, MRI may provide better evaluation if clinically indicated.   Electronically Signed   By: Elgie Collard M.D.   On: 05/29/2015 21:50     EKG Interpretation   Date/Time:  Thursday May 29 2015 21:13:59 EDT Ventricular Rate:  65 PR Interval:  227 QRS Duration: 109 QT Interval:  437 QTC Calculation: 454 R Axis:   63 Text Interpretation:  Sinus rhythm Prolonged PR interval Probable inferior  infarct, old diffuse minimal ST elevation Confirmed by Manus Gunning  MD,  Galina Haddox [id number]) on 05/29/2015 9:30:36 PM      MDM   Final diagnoses:  Umbilical hernia  Fall  confusion with fall today.  No LOC.  On coumadin for history of PE.  Labs, CXR, CT head, UA.  Labs show mild elevation of creatinine 1.5. INR is therapeutic. CT head is negative for acute hemorrhage. CT abdomen shows no bowel in umbilical hernia.  Patient's mental status is  improving per granddaughter. She is oriented 2 which is her baseline. She denies any pain complaints.  She is tolerating PO and ambulatory.  Workup reassuring.  Advised to followup with PCP for recheck of creatinine.  IVF given.  Troponin and UA pending at time of sign out to Dr. Fayrene Fearing.  Anticipate discharge home if there are unremarkable.     Glynn Octave, MD 05/29/15 [phone]

## 2015-05-29 NOTE — Discharge Instructions (Signed)

## 2015-05-29 NOTE — ED Notes (Signed)
MD at bedside. Repeat EKG done at this time

## 2015-05-29 NOTE — ED Notes (Addendum)
Pt. Caregiver reports pt. With increased confusion x2 days. Reports pt. With fall today but did not hit head. Pt. With abrasion to left forearm. Pt. Oriented to self and place.

## 2015-05-29 NOTE — ED Provider Notes (Signed)
Care discussed with Dr. Manus Gunningancour. Patient's care assumed. Patient examined. I have discussed findings at length with the patient's daughter.  I have seen the patient ambulating the hallway. She is taking by mouth.  I discussed with the daughter that my first instinct in a 81 year old with an episode of confusion, and a fall today, as well as urinary tract infection would be for admission to the hospital. She very politely discusses this further. She strongly prefers to take her grandmother home. She states that she has been staying with her. Patient normally lives with her daughter who is out of town, thus the granddaughter staying with her and is with her full time now. She feels that her grandmother would do worse in the hospital that she has been treated at home with urinary tract infections in the past. I discussed with her fall precautions and invited her to return with any failure to improve at home. She was given IV Rocephin. Discharged with Keflex. Awaiting culture. Encourage them to follow-up with her physician on Monday for culture results. Again, urged a very low threshold for return at home with any difficulties.    Rolland PorterMark Sydna Brodowski, MD 05/29/15 [phone]

## 2015-05-30 NOTE — ED Notes (Signed)
Family wanting to know if patient will go home with a prescription or given IV or PO meds. Will check with EDP.

## 2015-10-10 ENCOUNTER — Emergency Department (HOSPITAL_COMMUNITY): Payer: Medicare Other

## 2015-10-10 ENCOUNTER — Encounter (HOSPITAL_COMMUNITY): Payer: Self-pay

## 2015-10-10 ENCOUNTER — Emergency Department (HOSPITAL_COMMUNITY)
Admission: EM | Admit: 2015-10-10 | Discharge: 2015-10-10 | Disposition: A | Discharge status: Home / Self Care | Payer: Medicare Other | Attending: Emergency Medicine | Admitting: Emergency Medicine

## 2015-10-10 DIAGNOSIS — Y92009 Unspecified place in unspecified non-institutional (private) residence as the place of occurrence of the external cause: Secondary | ICD-10-CM | POA: Insufficient documentation

## 2015-10-10 DIAGNOSIS — T148 Other injury of unspecified body region: Secondary | ICD-10-CM | POA: Diagnosis not present

## 2015-10-10 DIAGNOSIS — J45909 Unspecified asthma, uncomplicated: Secondary | ICD-10-CM | POA: Insufficient documentation

## 2015-10-10 DIAGNOSIS — I509 Heart failure, unspecified: Secondary | ICD-10-CM | POA: Diagnosis not present

## 2015-10-10 DIAGNOSIS — R002 Palpitations: Secondary | ICD-10-CM | POA: Insufficient documentation

## 2015-10-10 DIAGNOSIS — R531 Weakness: Secondary | ICD-10-CM | POA: Diagnosis not present

## 2015-10-10 DIAGNOSIS — W19XXXA Unspecified fall, initial encounter: Secondary | ICD-10-CM | POA: Insufficient documentation

## 2015-10-10 DIAGNOSIS — S299XXA Unspecified injury of thorax, initial encounter: Secondary | ICD-10-CM | POA: Diagnosis present

## 2015-10-10 DIAGNOSIS — Y939 Activity, unspecified: Secondary | ICD-10-CM | POA: Insufficient documentation

## 2015-10-10 DIAGNOSIS — M1990 Unspecified osteoarthritis, unspecified site: Secondary | ICD-10-CM | POA: Insufficient documentation

## 2015-10-10 DIAGNOSIS — E079 Disorder of thyroid, unspecified: Secondary | ICD-10-CM | POA: Diagnosis not present

## 2015-10-10 DIAGNOSIS — E119 Type 2 diabetes mellitus without complications: Secondary | ICD-10-CM | POA: Diagnosis not present

## 2015-10-10 DIAGNOSIS — Z955 Presence of coronary angioplasty implant and graft: Secondary | ICD-10-CM | POA: Diagnosis not present

## 2015-10-10 DIAGNOSIS — I10 Essential (primary) hypertension: Secondary | ICD-10-CM | POA: Diagnosis not present

## 2015-10-10 DIAGNOSIS — Y998 Other external cause status: Secondary | ICD-10-CM | POA: Diagnosis not present

## 2015-10-10 DIAGNOSIS — S3991XA Unspecified injury of abdomen, initial encounter: Secondary | ICD-10-CM | POA: Diagnosis not present

## 2015-10-10 DIAGNOSIS — Z79899 Other long term (current) drug therapy: Secondary | ICD-10-CM | POA: Insufficient documentation

## 2015-10-10 NOTE — ED Provider Notes (Signed)
CSN: [id number]     Arrival date & time 10/10/15  1555 History   First MD Initiated Contact with Patient 10/10/15 1626     Chief Complaint  Patient presents with  . Fall  . Weakness     (Consider location/radiation/quality/duration/timing/severity/associated sxs/prior Treatment) Patient is a 81 y.o. female presenting with fall and weakness. The history is provided by a caregiver (The granddaughter states that the patient is only left alone for a short period time. When the granddaughter came in she found the patient on the floor in her bedroom. No loss of consciousness. Patient complaining of right flank and lateral chest pain).  Fall This is a new problem. The current episode started 6 to 12 hours ago. The problem occurs constantly. The problem has not changed since onset.Associated symptoms include chest pain. Pertinent negatives include no abdominal pain and no headaches. Exacerbated by: Palpitations. Nothing relieves the symptoms. She has tried nothing for the symptoms.  Weakness Associated symptoms include chest pain. Pertinent negatives include no abdominal pain and no headaches.    Past Medical History  Diagnosis Date  . Arthritis   . Hypertension   . Thyroid disease   . Diabetes mellitus without complication (HCC)   . CHF (congestive heart failure) (HCC)   . Asthma    Past Surgical History  Procedure Laterality Date  . Coronary stent placement     No family history on file. Social History  Substance Use Topics  . Smoking status: Never Smoker   . Smokeless tobacco: None  . Alcohol Use: No   OB History    No data available     Review of Systems  Constitutional: Negative for appetite change and fatigue.  HENT: Negative for congestion, ear discharge and sinus pressure.   Eyes: Negative for discharge.  Respiratory: Negative for cough.   Cardiovascular: Positive for chest pain.  Gastrointestinal: Negative for abdominal pain and diarrhea.  Genitourinary: Negative  for frequency and hematuria.  Musculoskeletal: Negative for back pain.  Skin: Negative for rash.  Neurological: Positive for weakness. Negative for seizures and headaches.  Psychiatric/Behavioral: Negative for hallucinations.      Allergies  Doxycycline and Ultram  Home Medications   Prior to Admission medications   Medication Sig Start Date End Date Taking? Authorizing Provider  albuterol (PROVENTIL HFA;VENTOLIN HFA) 108 (90 BASE) MCG/ACT inhaler Inhale 1-2 puffs into the lungs every 6 (six) hours as needed for wheezing or shortness of breath.   Yes Historical Provider, MD  atorvastatin (LIPITOR) 40 MG tablet Take 40 mg by mouth every morning.    Yes Historical Provider, MD  benazepril (LOTENSIN) 10 MG tablet TAKE 1 TAB(S) ONCE A DAY FOR 30 DAY(S) 09/25/15  Yes Historical Provider, MD  busPIRone (BUSPAR) 5 MG tablet TAKE 1 TAB(S) 3 TIMES A DAY AS NEEDED FOR ANXIETY 09/25/15  Yes Historical Provider, MD  DULoxetine (CYMBALTA) 20 MG capsule TAKE ONE CAPSULE BY MOUTH TWICE A DAY FOR 30 DAYS 09/25/15  Yes Historical Provider, MD  fexofenadine (ALLEGRA) 180 MG tablet Take 180 mg by mouth daily as needed for allergies.    Yes Historical Provider, MD  furosemide (LASIX) 40 MG tablet Take 40 mg by mouth 2 (two) times daily.    Yes Historical Provider, MD  gabapentin (NEURONTIN) 100 MG capsule Take 300 mg by mouth 2 (two) times daily.    Yes Historical Provider, MD  HYDROcodone-acetaminophen (NORCO/VICODIN) 5-325 MG per tablet Take 1 tablet by mouth 2 (two) times daily.    Yes  Historical Provider, MD  levothyroxine (SYNTHROID, LEVOTHROID) 75 MCG tablet Take 75 mcg by mouth at bedtime.   Yes Historical Provider, MD  Melatonin 2.5 MG CAPS Take 2.5 mg by mouth at bedtime as needed (FOR SLEEP).    Yes Historical Provider, MD  metFORMIN (GLUCOPHAGE) 850 MG tablet Take 850 mg by mouth 2 (two) times daily with a meal.   Yes Historical Provider, MD  metoprolol succinate (TOPROL-XL) 25 MG 24 hr tablet Take  25 mg by mouth every morning.    Yes Historical Provider, MD  mirabegron ER (MYRBETRIQ) 25 MG TB24 tablet Take 25 mg by mouth every evening.    Yes Historical Provider, MD  omeprazole (PRILOSEC) 20 MG capsule TAKE 1 CAP(S) ONCE A DAY FOR 30 DAY(S) 09/25/15  Yes Historical Provider, MD  ondansetron (ZOFRAN) 4 MG tablet TAKE 1/2 TO 1 TAB(S) EVERY 8 HOURS AS NEEDED FOR NAUSEA 09/25/15  Yes Historical Provider, MD  potassium chloride (MICRO-K) 10 MEQ CR capsule Take 10 mEq by mouth 2 (two) times daily.   Yes Historical Provider, MD  cephALEXin (KEFLEX) 500 MG capsule Take 1 capsule (500 mg total) by mouth 3 (three) times daily. Patient not taking: Reported on 10/10/2015 05/29/15   Rolland Porter, MD  warfarin (COUMADIN) 10 MG tablet Take 10 mg by mouth See admin instructions. HOLD    Historical Provider, MD   BP 155/62 mmHg  Pulse 108  Temp(Src) 98.6 F (37 C) (Oral)  Resp 20  Ht 5\' 5"  (1.651 m)  Wt 154 lb (69.854 kg)  BMI 25.63 kg/m2  SpO2 90% Physical Exam  Constitutional: She is oriented to person, place, and time. She appears well-developed.  HENT:  Head: Normocephalic.  Eyes: Conjunctivae and EOM are normal. No scleral icterus.  Neck: Neck supple. No thyromegaly present.  Cardiovascular: Normal rate and regular rhythm.  Exam reveals no gallop and no friction rub.   No murmur heard. Pulmonary/Chest: No stridor. She has no wheezes. She has no rales. She exhibits tenderness.  Patient is tender to right lateral chest  Abdominal: She exhibits no distension. There is tenderness. There is no rebound.  Tender right upper quadrant  Musculoskeletal: Normal range of motion. She exhibits no edema.  Lymphadenopathy:    She has no cervical adenopathy.  Neurological: She is oriented to person, place, and time. She exhibits normal muscle tone. Coordination normal.  Skin: No rash noted. No erythema.  Psychiatric: She has a normal mood and affect. Her behavior is normal.    ED Course  Procedures  (including critical care time) Labs Review Labs Reviewed - No data to display  Imaging Review Ct Abdomen Pelvis Wo Contrast  10/10/2015  CLINICAL DATA:  Caregiver reports pt has been shaky and weak for the past month or so. Reports she fell in the shower last night and fell again today. Pt c/o pain in lower back. Reports is on coumadin and recently her INR was elevated EXAM: CT CHEST, ABDOMEN AND PELVIS WITHOUT CONTRAST TECHNIQUE: Multidetector CT imaging of the chest, abdomen and pelvis was performed following the standard protocol without IV contrast. COMPARISON:  05/29/2015 FINDINGS: CT CHEST FINDINGS No mediastinal hematoma. No pleural or pericardial effusion. No pneumothorax. Moderate coronary calcifications. Patchy aortic plaque without aneurysm. No definite hilar or mediastinal adenopathy, sensitivity decreased without IV contrast. Patchy atelectasis posteriorly at both lung bases. Lungs are otherwise clear. Mild spondylitic changes in the mid and lower thoracic spine. Sternum intact. CT ABDOMEN AND PELVIS FINDINGS Unremarkable liver, nondilated gallbladder, spleen,  adrenal glands, kidneys, pancreas. Unenhanced CT was performed per clinician order. Lack of IV contrast limits sensitivity and specificity, especially for evaluation of abdominal/pelvic solid viscera. Atheromatous abdominal aorta, ectatic in its infrarenal segment to 2.7 cm. Stomach, small bowel, and colon are nondilated. Normal appendix. Scattered colonic diverticula without adjacent inflammatory/edematous change. Urinary bladder physiologically distended. Uterus and adnexal regions unremarkable. No ascites.  No free air.  No adenopathy localized. Small umbilical hernia containing only mesenteric fat. Spondylitic changes throughout the lumbar spine. Early grade 1 anterolisthesis L5-S1 probably secondary to advanced facet DJD at this level; no pars defect evident. IMPRESSION: 1. Negative for retroperitoneal hematoma or other acute  abnormality. 2. Atherosclerosis, including aortoiliac and coronary artery disease. Please note that although the presence of coronary artery calcium documents the presence of coronary artery disease, the severity of this disease and any potential stenosis cannot be assessed on this non-gated CT examination. Assessment for potential risk factor modification, dietary therapy or pharmacologic therapy may be warranted, if clinically indicated. 3. Colonic diverticulosis. Electronically Signed   By: Corlis Leak M.D.   On: 10/10/2015 18:07   Ct Head Wo Contrast  10/10/2015  CLINICAL DATA:  Fall in show are last night and again today. Low back pain. On blood thinners. Initial encounter. EXAM: CT HEAD WITHOUT CONTRAST CT CERVICAL SPINE WITHOUT CONTRAST TECHNIQUE: Multidetector CT imaging of the head and cervical spine was performed following the standard protocol without intravenous contrast. Multiplanar CT image reconstructions of the cervical spine were also generated. COMPARISON:  05/29/2015 and 12/14/2013 CTs. FINDINGS: CT HEAD FINDINGS There is no evidence of acute intracranial hemorrhage, mass lesion, brain edema or new extra-axial fluid collection. There are symmetric bifrontal hygromas with stable diffuse prominence of the ventricles and subarachnoid spaces. There is stable low-density in the periventricular white matter consistent with chronic small vessel ischemic change. No evidence of acute cortical based infarct. Intracranial vascular calcifications are noted. The visualized paranasal sinuses, mastoid air cells and middle ears are clear. The calvarium is intact. CT CERVICAL SPINE FINDINGS The alignment is stable with mild straightening. There is multilevel spondylosis with disc space loss and uncinate spurring, most advanced at C5-6 and C6-7. No evidence of acute fracture or traumatic subluxation. No acute soft tissue findings are demonstrated. The internal carotid arteries have a retropharyngeal course.  There is a partially calcified left thyroid nodule extending into tracheoesophageal groove, unchanged from the prior study. IMPRESSION: 1. No acute intracranial findings or evidence of calvarial fracture. 2. Stable atrophy and mild chronic small vessel ischemic changes. 3. No evidence of acute cervical spine fracture, traumatic subluxation or static signs of instability. 4. Stable spondylosis. Electronically Signed   By: Carey Bullocks M.D.   On: 10/10/2015 18:00   Ct Chest Wo Contrast  10/10/2015  CLINICAL DATA:  Caregiver reports pt has been shaky and weak for the past month or so. Reports she fell in the shower last night and fell again today. Pt c/o pain in lower back. Reports is on coumadin and recently her INR was elevated EXAM: CT CHEST, ABDOMEN AND PELVIS WITHOUT CONTRAST TECHNIQUE: Multidetector CT imaging of the chest, abdomen and pelvis was performed following the standard protocol without IV contrast. COMPARISON:  05/29/2015 FINDINGS: CT CHEST FINDINGS No mediastinal hematoma. No pleural or pericardial effusion. No pneumothorax. Moderate coronary calcifications. Patchy aortic plaque without aneurysm. No definite hilar or mediastinal adenopathy, sensitivity decreased without IV contrast. Patchy atelectasis posteriorly at both lung bases. Lungs are otherwise clear. Mild spondylitic changes in the  mid and lower thoracic spine. Sternum intact. CT ABDOMEN AND PELVIS FINDINGS Unremarkable liver, nondilated gallbladder, spleen, adrenal glands, kidneys, pancreas. Unenhanced CT was performed per clinician order. Lack of IV contrast limits sensitivity and specificity, especially for evaluation of abdominal/pelvic solid viscera. Atheromatous abdominal aorta, ectatic in its infrarenal segment to 2.7 cm. Stomach, small bowel, and colon are nondilated. Normal appendix. Scattered colonic diverticula without adjacent inflammatory/edematous change. Urinary bladder physiologically distended. Uterus and adnexal  regions unremarkable. No ascites.  No free air.  No adenopathy localized. Small umbilical hernia containing only mesenteric fat. Spondylitic changes throughout the lumbar spine. Early grade 1 anterolisthesis L5-S1 probably secondary to advanced facet DJD at this level; no pars defect evident. IMPRESSION: 1. Negative for retroperitoneal hematoma or other acute abnormality. 2. Atherosclerosis, including aortoiliac and coronary artery disease. Please note that although the presence of coronary artery calcium documents the presence of coronary artery disease, the severity of this disease and any potential stenosis cannot be assessed on this non-gated CT examination. Assessment for potential risk factor modification, dietary therapy or pharmacologic therapy may be warranted, if clinically indicated. 3. Colonic diverticulosis. Electronically Signed   By: Corlis Leak  Hassell M.D.   On: 10/10/2015 18:07   Ct Cervical Spine Wo Contrast  10/10/2015  CLINICAL DATA:  Fall in show are last night and again today. Low back pain. On blood thinners. Initial encounter. EXAM: CT HEAD WITHOUT CONTRAST CT CERVICAL SPINE WITHOUT CONTRAST TECHNIQUE: Multidetector CT imaging of the head and cervical spine was performed following the standard protocol without intravenous contrast. Multiplanar CT image reconstructions of the cervical spine were also generated. COMPARISON:  05/29/2015 and 12/14/2013 CTs. FINDINGS: CT HEAD FINDINGS There is no evidence of acute intracranial hemorrhage, mass lesion, brain edema or new extra-axial fluid collection. There are symmetric bifrontal hygromas with stable diffuse prominence of the ventricles and subarachnoid spaces. There is stable low-density in the periventricular white matter consistent with chronic small vessel ischemic change. No evidence of acute cortical based infarct. Intracranial vascular calcifications are noted. The visualized paranasal sinuses, mastoid air cells and middle ears are clear. The  calvarium is intact. CT CERVICAL SPINE FINDINGS The alignment is stable with mild straightening. There is multilevel spondylosis with disc space loss and uncinate spurring, most advanced at C5-6 and C6-7. No evidence of acute fracture or traumatic subluxation. No acute soft tissue findings are demonstrated. The internal carotid arteries have a retropharyngeal course. There is a partially calcified left thyroid nodule extending into tracheoesophageal groove, unchanged from the prior study. IMPRESSION: 1. No acute intracranial findings or evidence of calvarial fracture. 2. Stable atrophy and mild chronic small vessel ischemic changes. 3. No evidence of acute cervical spine fracture, traumatic subluxation or static signs of instability. 4. Stable spondylosis. Electronically Signed   By: Carey BullocksWilliam  Veazey M.D.   On: 10/10/2015 18:00   I have personally reviewed and evaluated these images and lab results as part of my medical decision-making.   EKG Interpretation None      MDM   Final diagnoses:  None    CT head and neck chest and abdomen were negative for trauma. Diagnosis contusion from mechanical fall. Patient is following up with her medical doctor.    Bethann BerkshireJoseph Mairlyn Tegtmeyer, MD 10/10/15 [phone]

## 2015-10-10 NOTE — ED Notes (Signed)
Un-witnessed fall at home. Patient very tender on her right posterior rib cage area. No obvious signs of head injury/trauma on assessment.Patient alert. Disoriented at baseline per granddaughter.

## 2015-10-10 NOTE — Discharge Instructions (Signed)
Follow up with your md as needed °

## 2015-10-10 NOTE — ED Notes (Signed)
Patient with no complaints at this time. Respirations even and unlabored. Skin warm/dry. Discharge instructions reviewed with patient at this time. Patient given opportunity to voice concerns/ask questions. Patient discharged at this time and left Emergency Department via wheelchair. 

## 2015-10-10 NOTE — ED Notes (Signed)
Caregiver reports pt has been shaky and weak for the past month or so.  Reports she fell in the shower last night and fell again today.  Pt c/o pain in lower back.  Reports is on coumadin and recently her INR was elevated and pt had to take a vitamin K pill 3 days ago.  Coumadin is currently on hold.

## 2015-10-23 DEATH — deceased

## 2016-07-14 IMAGING — CT CT HEAD W/O CM
1 series · 16 of 30 positions shown, 20 images · non-contrast
Comparison: CT dated 12/14/2013

CLINICAL DATA: [AGE] female with confusion and fall.

EXAM:
CT HEAD WITHOUT CONTRAST
TECHNIQUE: Contiguous axial images were obtained from the base of the skull
through the vertex without intravenous contrast.

[Series 2: headtrauma 4.8 h37s · axial · 0.43mm/px · z∈[+137,+275]mm · 16 of 30 slices shown, 20 images]
[im 2/30  brain]
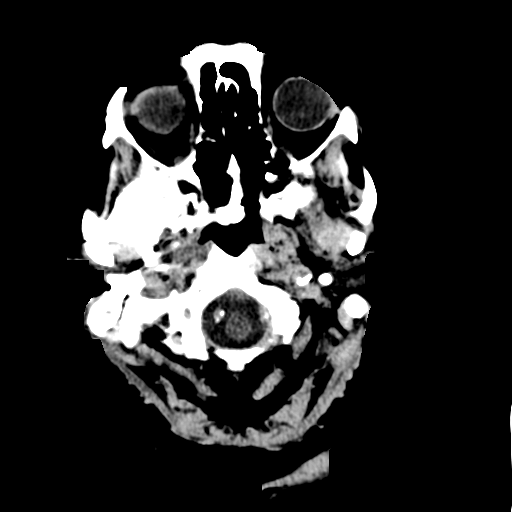
[im 2/30  bone]
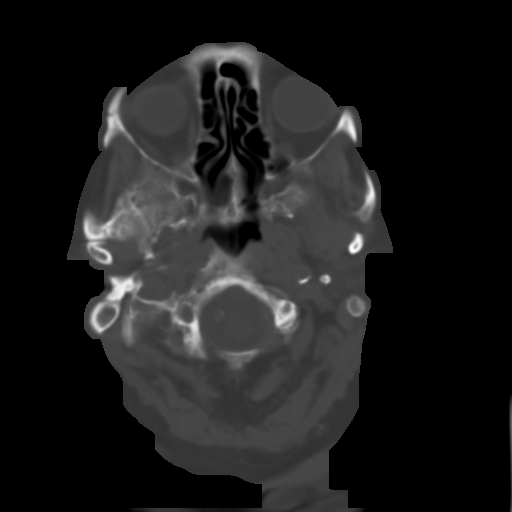
[im 4/30  brain]
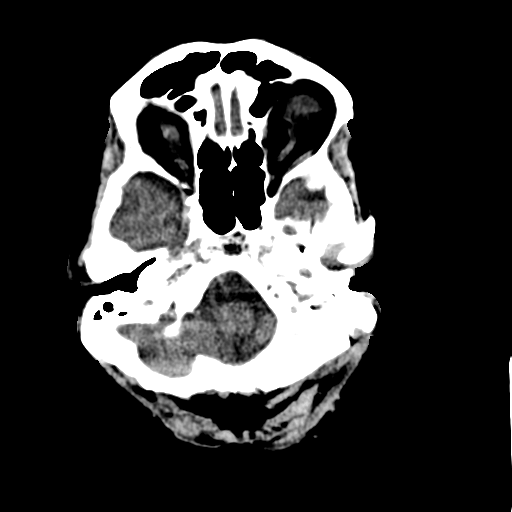
[im 6/30  brain]
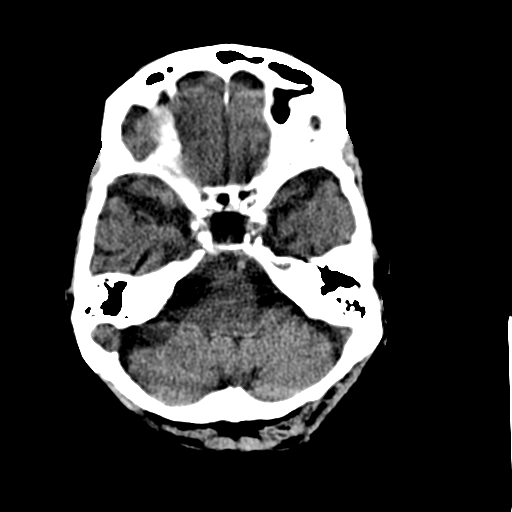
[im 8/30  brain]
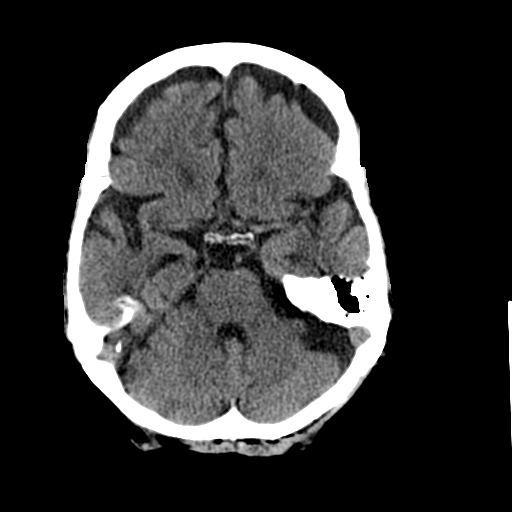
[im 9/30  brain]
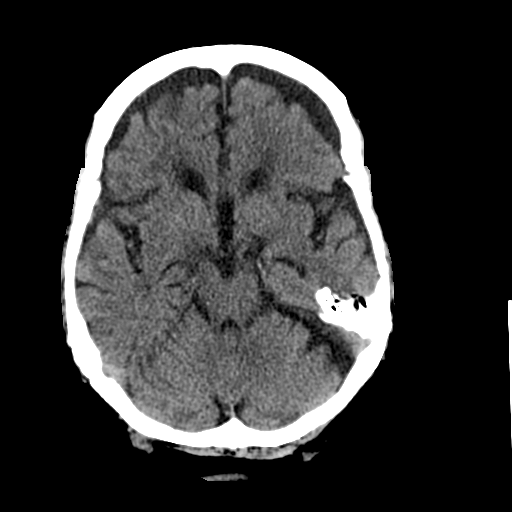
[im 9/30  bone]
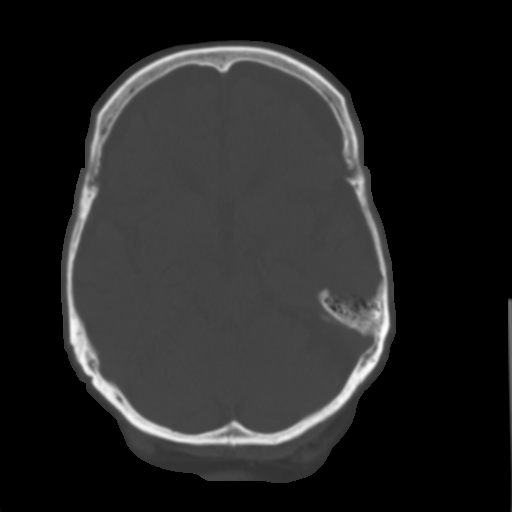
[im 11/30  brain]
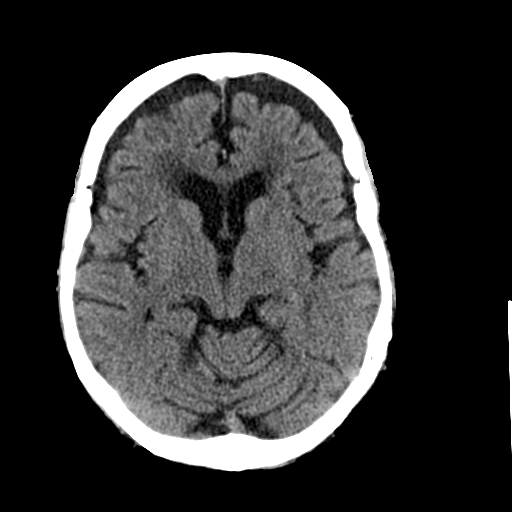
[im 13/30  brain]
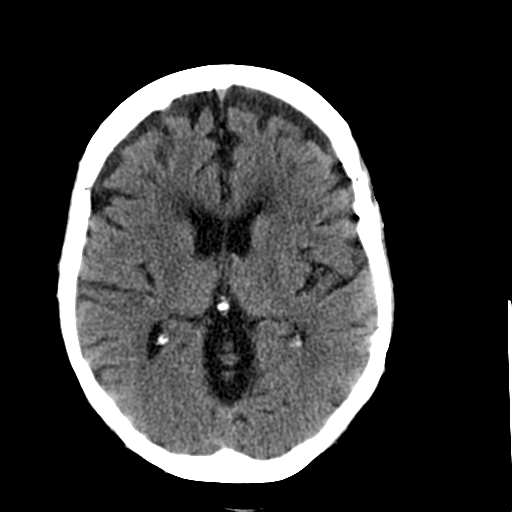
[im 15/30  brain]
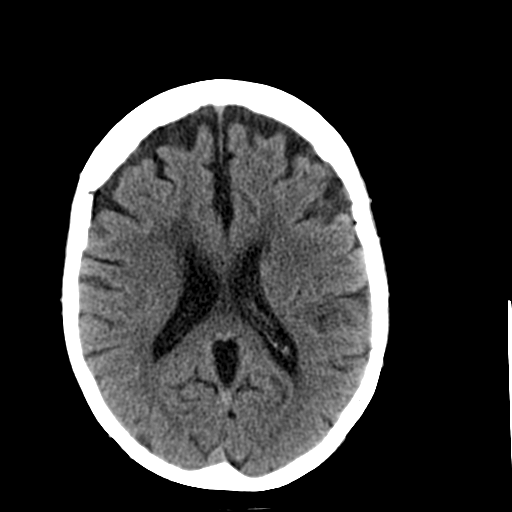
[im 16/30  brain]
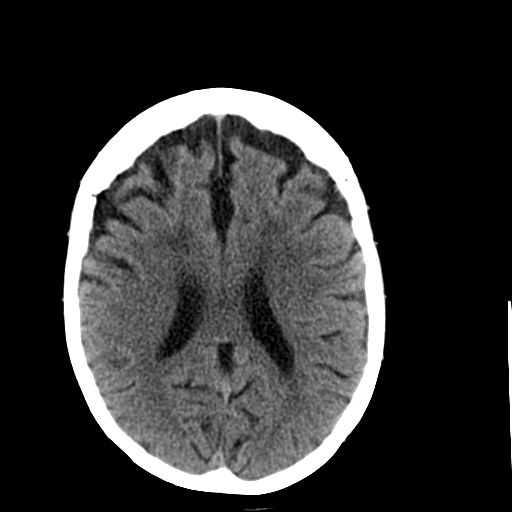
[im 16/30  bone]
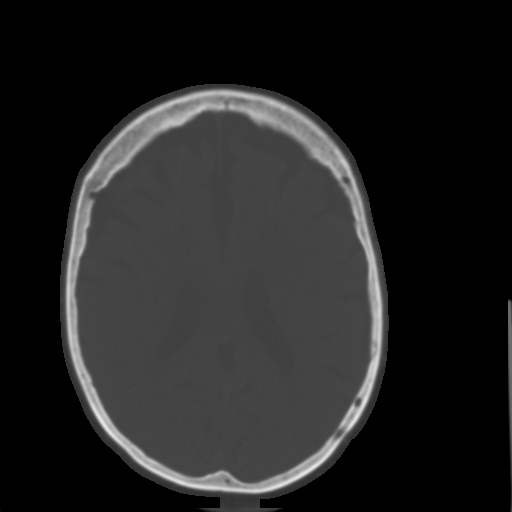
[im 18/30  brain]
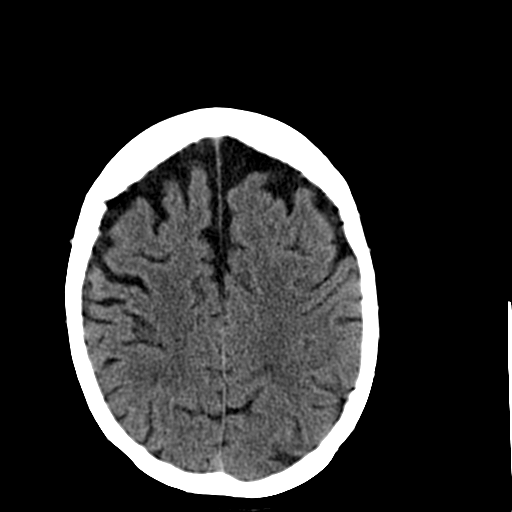
[im 20/30  brain]
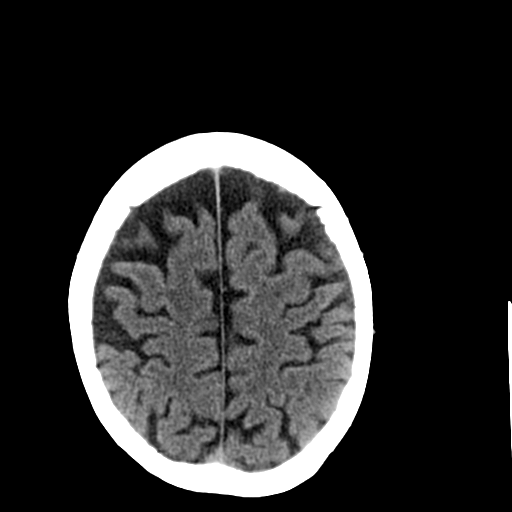
[im 22/30  brain]
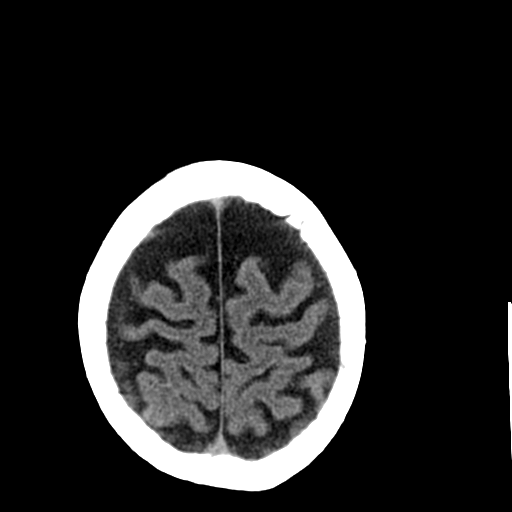
[im 23/30  brain]
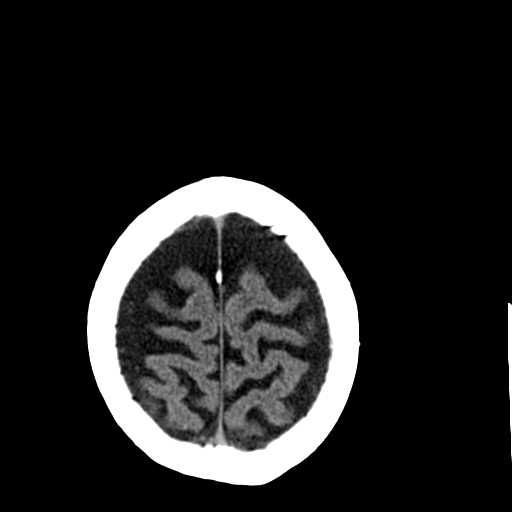
[im 23/30  bone]
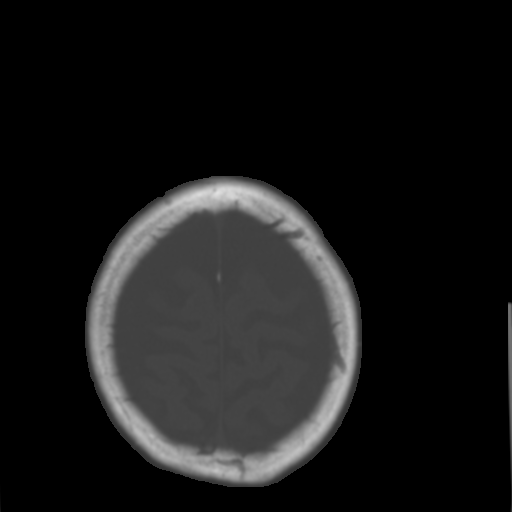
[im 25/30  brain]
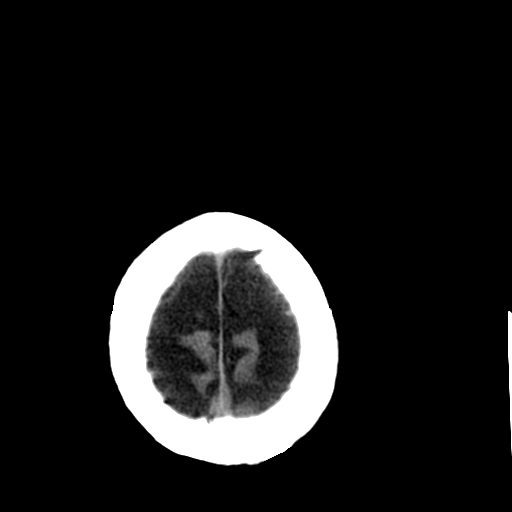
[im 27/30  brain]
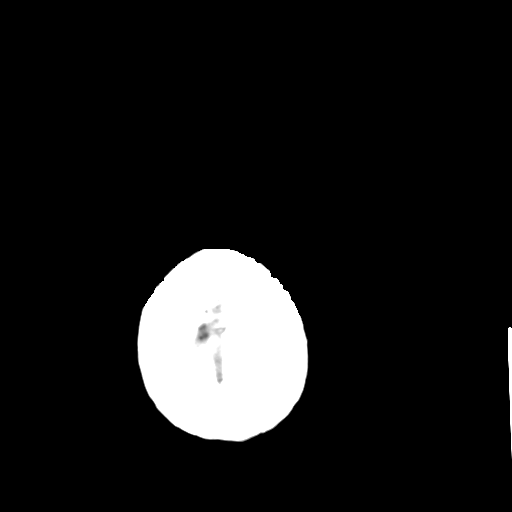
[im 29/30  brain]
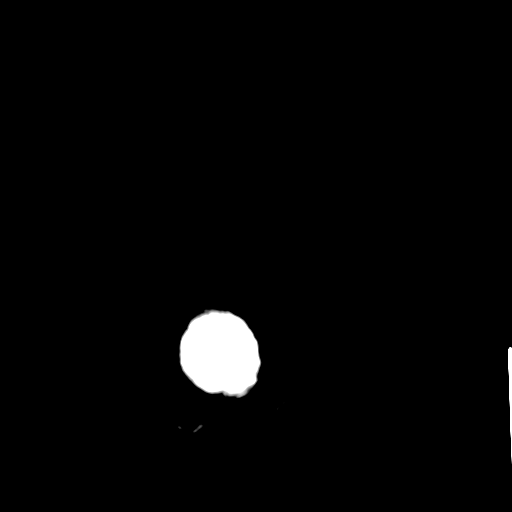

[16 of 30 positions shown; findings below may reference images not displayed]

FINDINGS: The ventricles are dilated and the sulci are prominent compatible
with age-related atrophy. There is bifrontal volume loss.
Periventricular and deep white matter hypodensities represent
chronic microvascular ischemic changes. There is no intracranial
hemorrhage. No mass effect or midline shift identified.

The visualized paranasal sinuses and mastoid air cells are well
aerated. The calvarium is intact.
IMPRESSION: No acute intracranial pathology.

Age-related atrophy and chronic microvascular ischemic disease.

If symptoms persist and there are no contraindications, MRI may
provide better evaluation if clinically indicated.

## 2016-07-14 IMAGING — DX DG CHEST 2V
2 series · 2 of 2 positions shown · non-contrast
Comparison: None.

CLINICAL DATA: Confusion.  Patient fell earlier today

EXAM:
CHEST  2 VIEW

[chest lat]
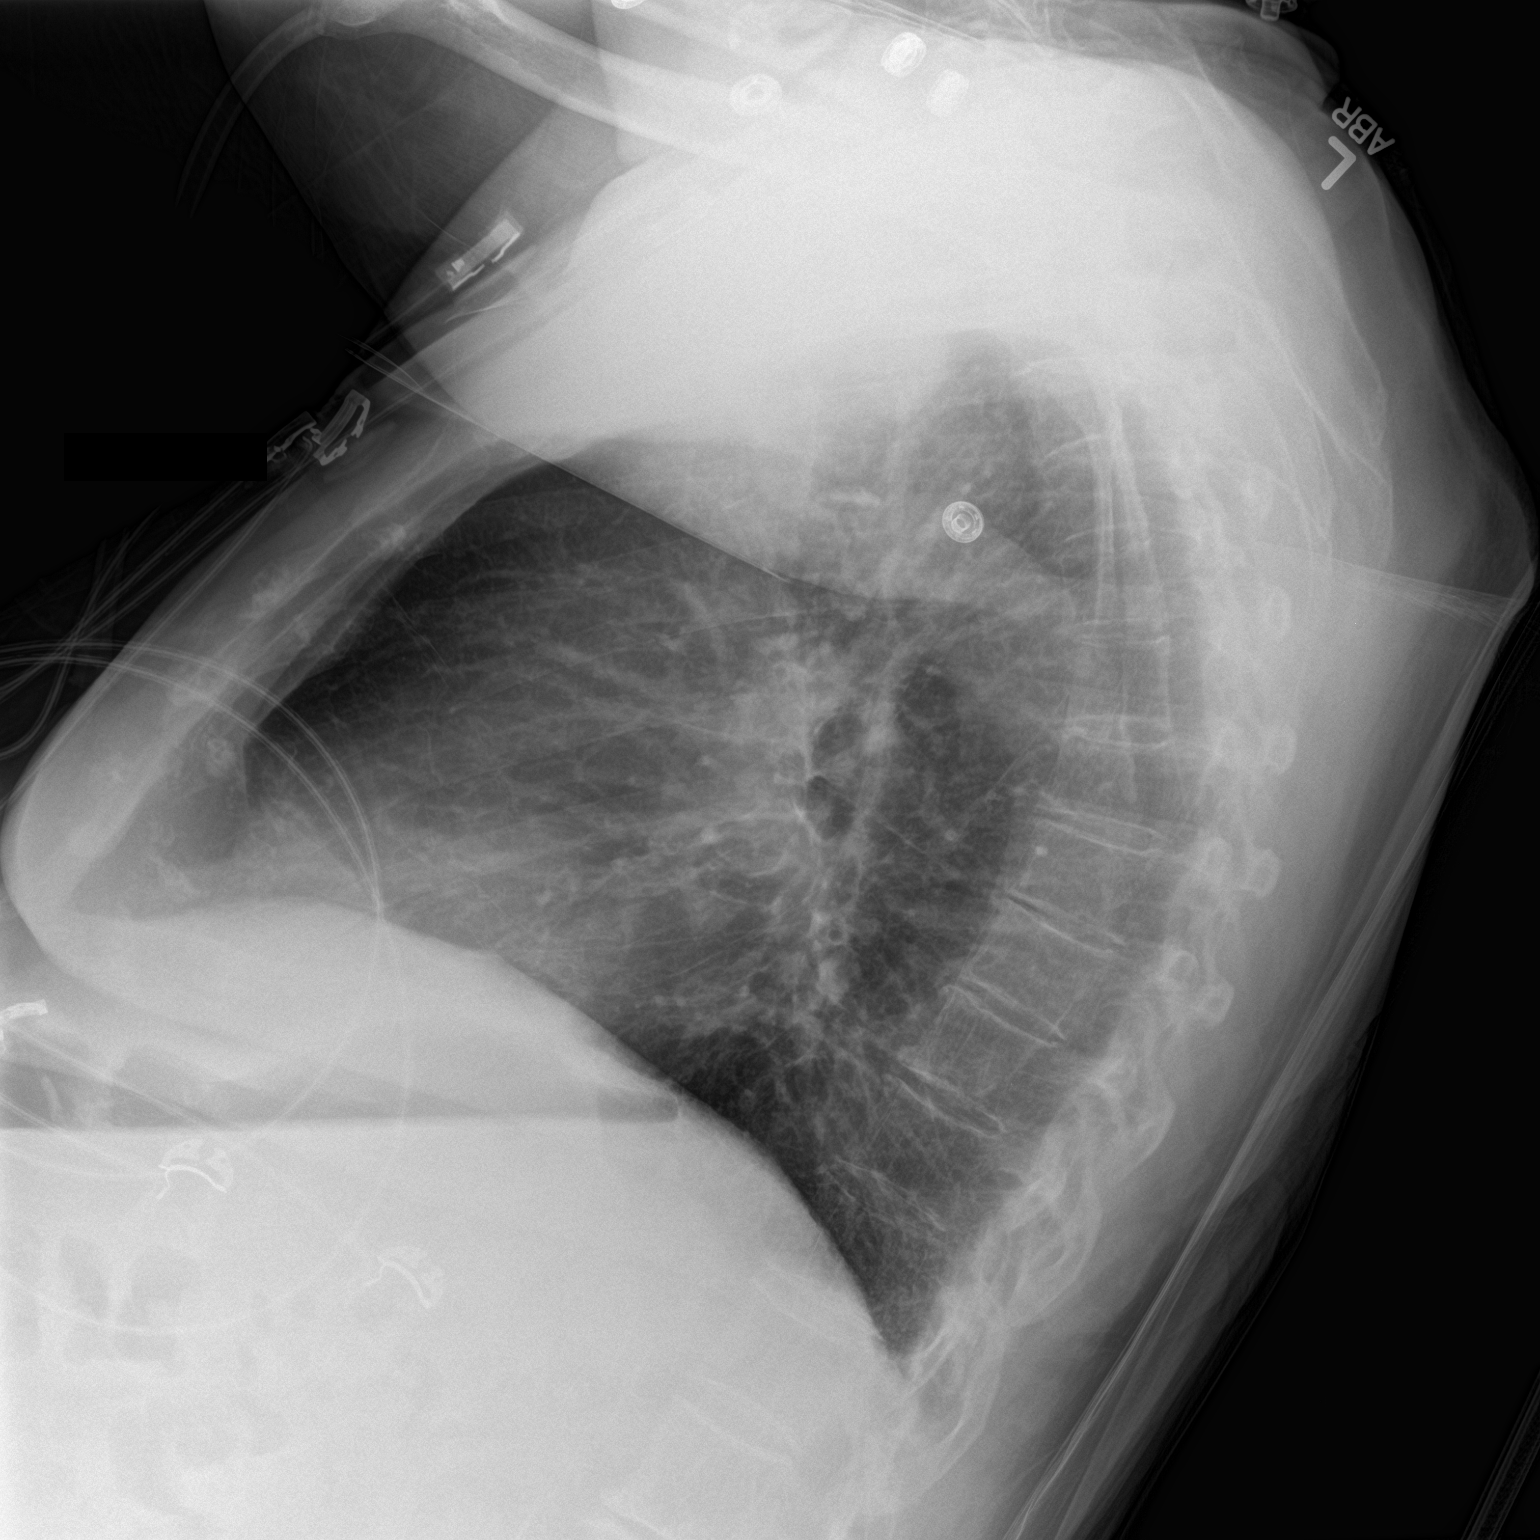

[chest ap strecther]
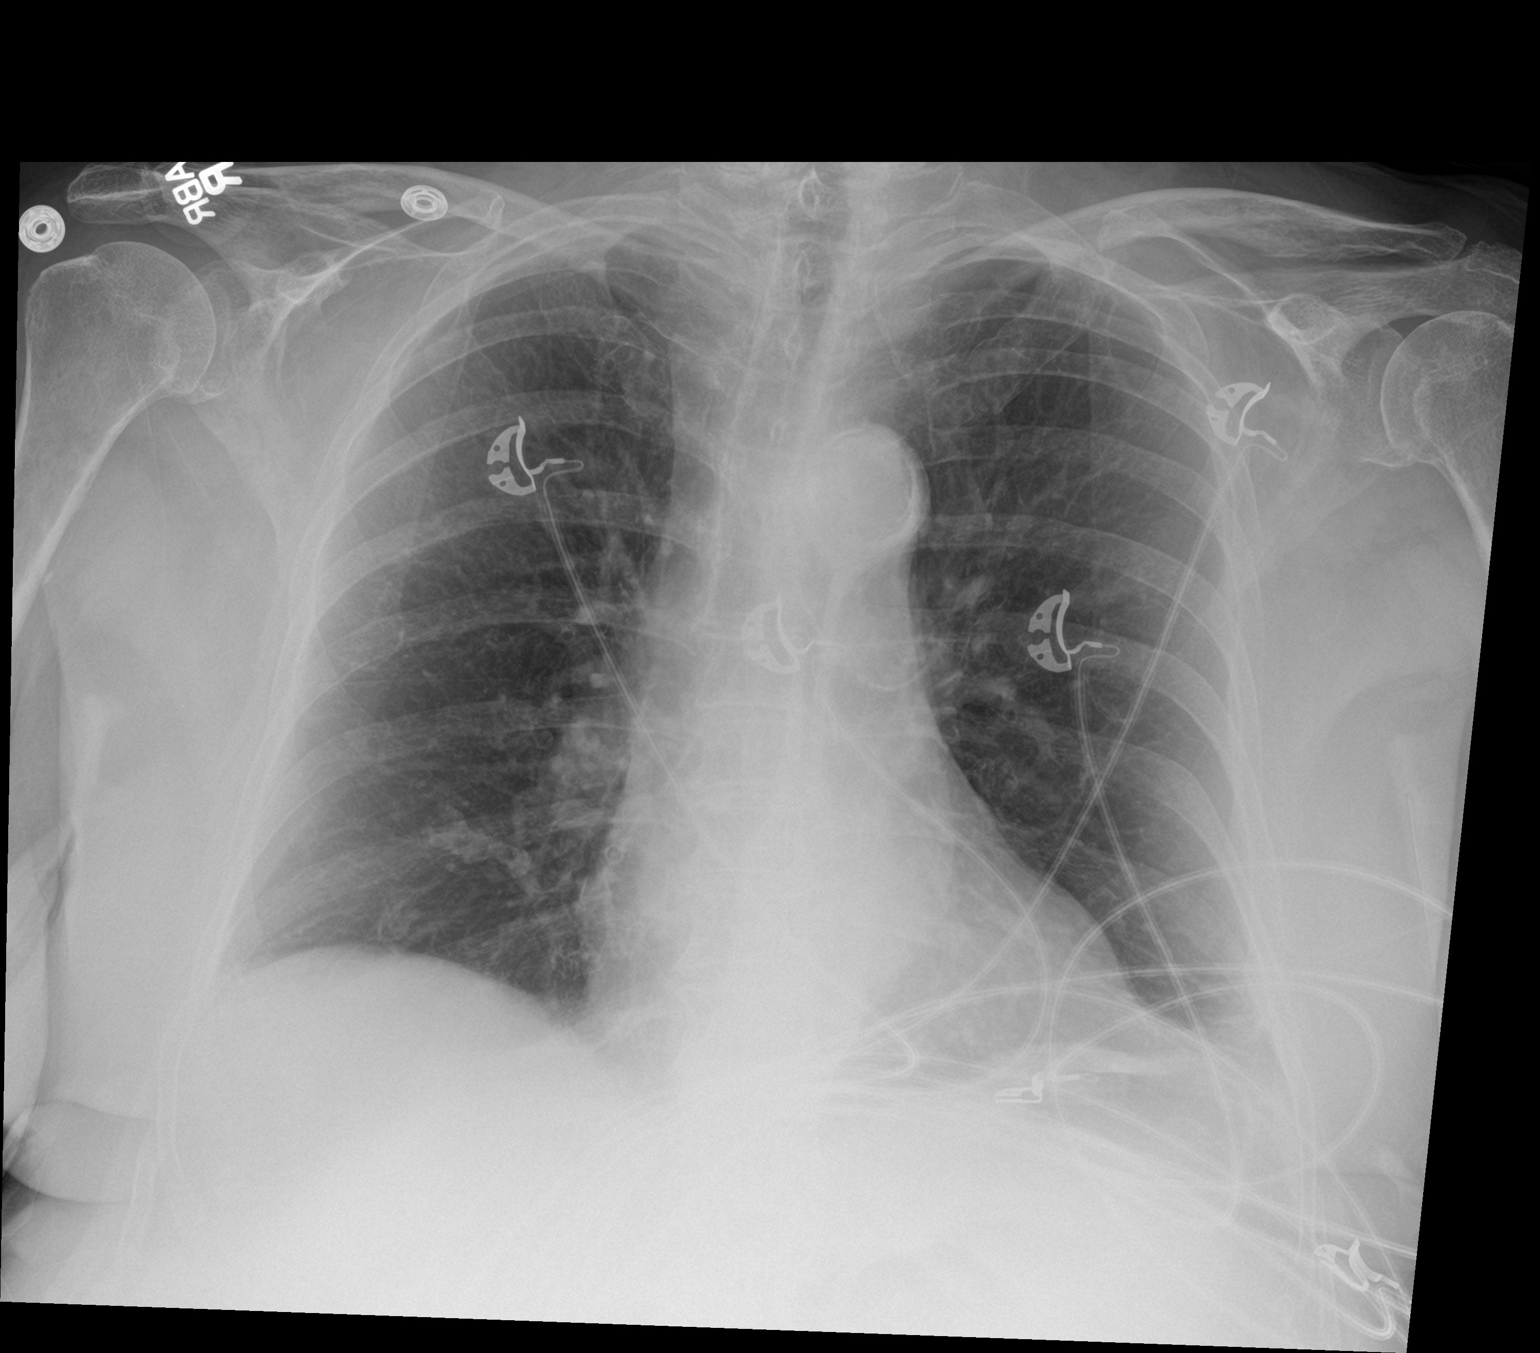

[2 of 2 positions shown; findings below may reference images not displayed]

FINDINGS: Lungs are clear. Heart size and pulmonary vascularity are normal.
There is atherosclerotic change in aorta. No adenopathy. No
pneumothorax. No fractures are identified.
IMPRESSION: Lungs clear.  No demonstrable pneumothorax.
